# Patient Record
Sex: Female | Born: 1970 | Race: White | Hispanic: No | Marital: Married | Smoking: Former smoker
Health system: Southern US, Community
[De-identification: ages and names within clinical notes are randomized; demographics above are authoritative.]

## PROBLEM LIST (undated history)

## (undated) DIAGNOSIS — K219 Gastro-esophageal reflux disease without esophagitis: Secondary | ICD-10-CM

## (undated) DIAGNOSIS — J342 Deviated nasal septum: Secondary | ICD-10-CM

## (undated) DIAGNOSIS — G4730 Sleep apnea, unspecified: Secondary | ICD-10-CM

## (undated) DIAGNOSIS — R51 Headache: Secondary | ICD-10-CM

## (undated) DIAGNOSIS — R053 Chronic cough: Secondary | ICD-10-CM

## (undated) DIAGNOSIS — R05 Cough: Secondary | ICD-10-CM

## (undated) DIAGNOSIS — J45909 Unspecified asthma, uncomplicated: Secondary | ICD-10-CM

## (undated) DIAGNOSIS — F988 Other specified behavioral and emotional disorders with onset usually occurring in childhood and adolescence: Secondary | ICD-10-CM

## (undated) DIAGNOSIS — T8859XA Other complications of anesthesia, initial encounter: Secondary | ICD-10-CM

## (undated) DIAGNOSIS — F419 Anxiety disorder, unspecified: Secondary | ICD-10-CM

## (undated) DIAGNOSIS — F40298 Other specified phobia: Secondary | ICD-10-CM

## (undated) DIAGNOSIS — T4145XA Adverse effect of unspecified anesthetic, initial encounter: Secondary | ICD-10-CM

## (undated) DIAGNOSIS — Z889 Allergy status to unspecified drugs, medicaments and biological substances status: Secondary | ICD-10-CM

## (undated) HISTORY — PX: WISDOM TOOTH EXTRACTION: SHX21

---

## 2000-10-26 ENCOUNTER — Other Ambulatory Visit: Admission: RE | Admit: 2000-10-26 | Discharge: 2000-10-26 | Payer: Self-pay | Admitting: Obstetrics and Gynecology

## 2000-11-10 ENCOUNTER — Emergency Department (HOSPITAL_COMMUNITY): Admission: EM | Admit: 2000-11-10 | Discharge: 2000-11-10 | Payer: Self-pay | Admitting: *Deleted

## 2001-03-16 ENCOUNTER — Ambulatory Visit (HOSPITAL_COMMUNITY): Admission: RE | Admit: 2001-03-16 | Discharge: 2001-03-16 | Payer: Self-pay | Admitting: Obstetrics and Gynecology

## 2001-03-16 ENCOUNTER — Encounter: Payer: Self-pay | Admitting: Obstetrics and Gynecology

## 2001-06-08 ENCOUNTER — Inpatient Hospital Stay (HOSPITAL_COMMUNITY): Admission: AD | Admit: 2001-06-08 | Discharge: 2001-06-12 | Payer: Self-pay | Admitting: Obstetrics and Gynecology

## 2001-06-13 ENCOUNTER — Encounter: Admission: RE | Admit: 2001-06-13 | Discharge: 2001-07-13 | Payer: Self-pay | Admitting: Obstetrics and Gynecology

## 2002-07-13 ENCOUNTER — Other Ambulatory Visit: Admission: RE | Admit: 2002-07-13 | Discharge: 2002-07-13 | Payer: Self-pay | Admitting: Obstetrics and Gynecology

## 2003-08-16 ENCOUNTER — Other Ambulatory Visit: Admission: RE | Admit: 2003-08-16 | Discharge: 2003-08-16 | Payer: Self-pay | Admitting: Obstetrics and Gynecology

## 2004-10-22 ENCOUNTER — Inpatient Hospital Stay (HOSPITAL_COMMUNITY): Admission: AD | Admit: 2004-10-22 | Discharge: 2004-10-25 | Payer: Self-pay | Admitting: Obstetrics and Gynecology

## 2004-11-27 ENCOUNTER — Other Ambulatory Visit: Admission: RE | Admit: 2004-11-27 | Discharge: 2004-11-27 | Payer: Self-pay | Admitting: Obstetrics and Gynecology

## 2008-03-27 ENCOUNTER — Emergency Department (HOSPITAL_COMMUNITY): Admission: EM | Admit: 2008-03-27 | Discharge: 2008-03-27 | Payer: Self-pay | Admitting: Emergency Medicine

## 2010-08-15 NOTE — Op Note (Signed)
NAMEMarland Kitchen  Jasmine Harrell, Jasmine Harrell              ACCOUNT NO.:  [id number]   MEDICAL RECORD NO.:  [id number]          PATIENT TYPE:  INP   LOCATION:  9137                          FACILITY:  [facility]   PHYSICIAN:  Miguel Aschoff, M.D.       DATE OF BIRTH:  Jun 20, 1970   DATE OF PROCEDURE:  10/22/2004  DATE OF DISCHARGE:                                 OPERATIVE REPORT   PREOPERATIVE DIAGNOSIS:  Intrauterine pregnancy at 38 weeks, previous  cesarean section, in labor.   POSTOPERATIVE DIAGNOSES:  1.  Intrauterine pregnancy at 38 weeks, previous cesarean section, in labor.  2.  Delivery of viable female infant, Apgars 9 and 9.   PROCEDURE:  Repeat low flap transverse cesarean section.   SURGEON:  Dr. Miguel Aschoff   ANESTHESIA:  Spinal.   COMPLICATIONS:  None.   JUSTIFICATION:  The patient is a 40 year old white female, gravida 2, para 1-  0-0-1, status post previous cesarean section.  The patient was scheduled to undergo elective repeat cesarean section on  July 28 but went into spontaneous labor with spontaneous rupture of  membranes at on July 26 and being taken to the operating room at this time  to undergo repeat cesarean section.  The risks, benefits of the procedure  were discussed with the patient.   PROCEDURE:  The patient was taken to the operating room, placed in the  supine position after spinal anesthesia was administered without difficulty.  She was then prepped and draped in the usual sterile fashion.  At this point  a Pfannenstiel incision was made, extended down through subcutaneous tissue  with bleeding points being clamped and coagulated as they were encountered.  The fascia was then identified, incised transversely and separated from the  underlying rectus muscles.  Rectus muscles were divided midline.  Peritoneum was then found and entered, carefully avoiding underlying  structures.  At this point the peritoneal incision was extended under direct  visualization.  The bladder flap was  created and protected with the bladder  blade.  An elliptical transverse incision was made into the lower uterine  segment, the amniotic cavity was entered.  Clear fluid was obtained, and at  this point the patient was delivery of a viable female infant, Apgar 9 at one  minute and 9 at five minutes, from a vertex LOA position.  The baby was  delivered with the assistance of a vacuum extractor.  The baby was handed to  the pediatric team in attendance.  The baby weighed 8 pounds 11 ounces.  At  this point cord bloods were obtained for appropriate studies and the  placenta was delivered without difficulty.  The remaining products of  conception and placenta were removed from the uterus and the uterus was  closed.  The angles of the uterine incision were ligated using figure-of-  eight sutures of number Vicryl.  Then the uterus was closed in layers.  The  first layer was a running interlocking suture of #1 Vicryl, followed by an  imbricating suture of #1 Vicryl.  The bladder flap was then reapproximated  using running continuous 2-0  Vicryl suture.  At this point the abdomen was  irrigated with warm saline and lap counts and instrument counts were taken  and found be correct and then the abdomen was closed.  The parietal  peritoneum was closed using running continuous 0 Vicryl suture.  Rectus  muscles were reapproximated using running continuous 0 Vicryl suture.  The  fascia was closed using two sutures of zero Vicryl, each starting at the  lateral fascial angles and meeting in the midline.  Subcutaneous tissue was  closed  using interrupted 2-0 Vicryl suture.  The skin incision was closed using  staples.  The estimated blood loss was approximately 600 mL.  The patient  tolerated the procedure well and went to the recovery room in satisfactory  condition.  The baby was taken to the nursery in satisfactory condition.       AR/MEDQ  D:  10/22/2004  T:  10/22/2004  Job:  [id number]

## 2010-08-15 NOTE — Discharge Summary (Signed)
Jasmine Harrell, Jasmine Harrell              ACCOUNT NO.:  [id number]   MEDICAL RECORD NO.:  [id number]          PATIENT TYPE:  INP   LOCATION:  9137                          FACILITY:  [facility]   PHYSICIAN:  Randye Lobo, M.D.   DATE OF BIRTH:  1971-01-16   DATE OF ADMISSION:  10/22/2004  DATE OF DISCHARGE:  10/25/2004                                 DISCHARGE SUMMARY   FINAL DIAGNOSES:  1.  Intrauterine pregnancy at 39 weeks' gestation.  2.  Active labor.  3.  History of previous cesarean section, desires repeat cesarean section.   PROCEDURE:  Repeat low flap transverse cesarean section.  Surgeon:  Dr.  Miguel Aschoff. Complications none.   This 40 year old G2, P1-0-0-01, was scheduled to undergo a repeat cesarean  section on July 28.  The patient had a previous cesarean section with her  last pregnancy and desired a repeat with this pregnancy.  Te patient's  antepartum course up to this point had been uncomplicated.  She had a  negative group B strep culture obtained in the office at 35 weeks.  The  patient went into active labor at 38 weeks and is therefore taken to the  operating room to undergo a cesarean section at this time.   The patient was taken to the operating room on October 22, 2004, by Dr. Miguel Aschoff, where a repeat low flap transverse cesarean section was performed with  the delivery of an 8 pound 11 ounce female infant without course of 9 and 9.  The delivery went without complications.  The patient's postoperative course  was benign without significant fevers.  She was felt ready for discharge on  postoperative day #3, was sent home on a regular diet, told to decrease  activities, told to continue her prenatal vitamins, was given Percocet one  to two every four to six hours as needed for pain, told she could use over-  the-counter ibuprofen up to 600 mg every six hours as needed for pain.  Was  to follow up in the office in four weeks.   LABS ON DISCHARGE:  The patient had a  hemoglobin of 12.2, white blood cell  count of 14.4, and platelets of 253,000.      Jasmine Harrell, P.A.-C.      Randye Lobo, M.D.  Electronically Signed    MB/MEDQ  D:  12/04/2004  T:  12/04/2004  Job:  [id number]

## 2010-08-15 NOTE — Discharge Summary (Signed)
[facility]  Patient:    Jasmine Harrell, Jasmine Harrell Visit Number: [id number] MRN: [id number]          Service Type: OBS Location: 910A 9142 01 Attending Physician:  Mickle Mallory Dictated by:   Leilani Able, P.A. Admit Date:  06/08/2001 Discharge Date: 06/12/2001                             Discharge Summary  FINAL DIAGNOSIS:              Intrauterine pregnancy at term with                               cephalopelvic disproportion.  PROCEDURE:                    Primary low transverse cesarean section.  SURGEON:                      Mark E. Dareen Piano, M.D.  COMPLICATIONS:                None.  HOSPITAL COURSE:              This 40 year old G1, P0, presents at 41-1/[redacted] weeks gestation in early labor.  The patient was admitted at this time, begun on Pitocin.  IUPCs were placed at that time to document adequate contraction. The patient got to about 9 cm dilation and failed to progress beyond this point.  The patient was felt to have a large infant, was diagnosed with cephalopelvic disproportion.  Therefore, a discussion was held with the patient at this time and it was thought to proceed with a primary low transverse cesarean section.  The patient was taken to the operating room on June 09, 2001, by Dr. Malva Limes, where a primary low transverse cesarean section was performed with the delivery of a 10 pound 2 ounce female infant with Apgars of 8 and 9.  The delivery was without complication.  The patients postoperative course was benign without significant fevers.  DISCHARGE DISPOSITION:        The patient was started on iron postoperatively, and she was felt ready for discharge on postoperative day #3, sent home on a regular diet, told to decrease activities, told to continue prenatal vitamins and iron sulfate, was given a prescription for Percocet and ibuprofen, and was told to follow up in the office in four weeks. Dictated by:   Leilani Able, P.A. Attending Physician:  Mickle Mallory DD:  06/23/01 TD:  06/24/01 Job: [id number]

## 2010-08-15 NOTE — Op Note (Signed)
[facility]  Patient:    Jasmine Harrell, Jasmine Harrell Visit Number: [id number] MRN: [id number]          Service Type: OBS Location: 910A 9142 01 Attending Physician:  Mickle Mallory Dictated by:   Janeece Riggers Dareen Piano, M.D. Proc. Date: 06/09/01 Admit Date:  06/08/2001   CC:         [name], M.D.   Operative Report  PREOPERATIVE DIAGNOSES:       1. Intrauterine pregnancy at term.                               2. Cephalopelvic disproportion.  POSTOPERATIVE DIAGNOSES:      1. Intrauterine pregnancy at term.                               2. Cephalopelvic disproportion.  PROCEDURE:                    Primary low transverse cesarean section.  SURGEON:                      Mark E. Dareen Piano, M.D.  ANESTHESIA:                   Epidural.  ANTIBIOTICS:                  Cefotan 1 g.  ESTIMATED BLOOD LOSS:         900 cc.  COMPLICATIONS:                None.  SPECIMENS:                    None.  FINDINGS:                     The patient had normal fallopian tubes and ovaries bilaterally.  The uterus appeared to be normal.  The patient did have a minimal amount of meconium.  The placenta and umbilical cord were meconium stained.  The patient delivered one live, viable white female infant weighing 10 lb 2 oz.  INDICATIONS:                  The patient is a 40 year old white female, G1, P0 at 41-1/2 weeks estimated gestational age who presented in early labor on June 08, 2001.  The patient was admitted and begun on Pitocin after she failed to significantly change her cervix at approximately 1:00 a.m.   The patient had no significant change throughout the next eight hours.  IUPC was placed during that time to document adequate contractions.  The patient got to 9 cm and failed to progress beyond this.  The patient was felt to have a large infant and diagnosed with cephalopelvic disproportion.  DESCRIPTION OF PROCEDURE:     The patient was taken to the  operating room, where she was placed in the dorsal supine position.  Epidural anesthetic was reinjected.  Once an adequate level was reached, the patient was prepped with Hibiclens and draped in the usual fashion for this procedure.  The patient did have a Foley catheter placed.  A Pfannenstiel incision was made.  This was carried down to the fascia.  The fascia was entered in the midline and extended laterally with Mayo scissors.  The rectus muscles were then  dissected from the fascia with the Bovie.  The rectus muscles were divided in the midline and taken superiorly and inferiorly.  The parietal peritoneum was entered sharply and taken superiorly and inferiorly.  The bladder flap was taken down sharply.  A low transverse uterine incision was made in the midline and extended laterally with blunt dissection.  On entering the uterine cavity, the amniotic sac was entered and light meconium noted.  The infant was delivered from the vertex presentation.  On delivery of the head, the oropharynx and nostrils were bulb suctioned.  The remaining infant was then delivered.  The cord was doubly clamped and cut and the infant handed to the awaiting NICU team.  Cord blood was then obtained.  The placenta was then manually removed.  The uterus was exteriorized.  The uterine cavity was wiped with a wet lap.  The uterine incision was closed in a single layer of 0 chromic in a running locking fashion.  The visceral peritoneum was closed using 3-0 chromic in a running fashion.  The uterus was placed back in the abdominal cavity.  The abdominal gutters were wiped with a wet lap. Hemostasis was checked and felt to be adequate.  The parietal peritoneum and rectus muscles were reapproximated in the midline using 3-0 chromic in a running fashion.  The fascia was closed using 0 Monocryl in a running fashion. The subcuticular tissue was made hemostatic with the Bovie.  Stainless steel clips were used to close the  skin.  The patient tolerated the procedure well. She was taken to the recovery room in stable condition.  Instrument and lap counts were correct x2. Dictated by:   Janeece Riggers Dareen Piano, M.D. Attending Physician:  Mickle Mallory DD:  06/09/01 TD:  06/10/01 Job: [id number]

## 2012-04-30 DIAGNOSIS — J342 Deviated nasal septum: Secondary | ICD-10-CM

## 2012-04-30 HISTORY — DX: Deviated nasal septum: J34.2

## 2012-05-19 ENCOUNTER — Encounter (HOSPITAL_BASED_OUTPATIENT_CLINIC_OR_DEPARTMENT_OTHER): Payer: Self-pay | Admitting: *Deleted

## 2012-05-25 ENCOUNTER — Encounter (HOSPITAL_BASED_OUTPATIENT_CLINIC_OR_DEPARTMENT_OTHER): Payer: Self-pay | Admitting: Certified Registered"

## 2012-05-25 ENCOUNTER — Encounter (HOSPITAL_BASED_OUTPATIENT_CLINIC_OR_DEPARTMENT_OTHER)
Admission: RE | Disposition: A | Discharge status: Home / Self Care | Payer: Self-pay | Source: Ambulatory Visit | Attending: Otolaryngology

## 2012-05-25 ENCOUNTER — Ambulatory Visit (HOSPITAL_BASED_OUTPATIENT_CLINIC_OR_DEPARTMENT_OTHER): Payer: BC Managed Care – PPO | Admitting: Certified Registered"

## 2012-05-25 ENCOUNTER — Ambulatory Visit (HOSPITAL_BASED_OUTPATIENT_CLINIC_OR_DEPARTMENT_OTHER)
Admission: RE | Admit: 2012-05-25 | Discharge: 2012-05-25 | Disposition: A | Discharge status: Home / Self Care | Payer: BC Managed Care – PPO | Source: Ambulatory Visit | Attending: Otolaryngology | Admitting: Otolaryngology

## 2012-05-25 ENCOUNTER — Encounter (HOSPITAL_BASED_OUTPATIENT_CLINIC_OR_DEPARTMENT_OTHER): Payer: Self-pay | Admitting: *Deleted

## 2012-05-25 DIAGNOSIS — J45909 Unspecified asthma, uncomplicated: Secondary | ICD-10-CM | POA: Insufficient documentation

## 2012-05-25 DIAGNOSIS — Z79899 Other long term (current) drug therapy: Secondary | ICD-10-CM | POA: Insufficient documentation

## 2012-05-25 DIAGNOSIS — J343 Hypertrophy of nasal turbinates: Secondary | ICD-10-CM | POA: Insufficient documentation

## 2012-05-25 DIAGNOSIS — K219 Gastro-esophageal reflux disease without esophagitis: Secondary | ICD-10-CM | POA: Insufficient documentation

## 2012-05-25 DIAGNOSIS — J342 Deviated nasal septum: Secondary | ICD-10-CM | POA: Diagnosis present

## 2012-05-25 HISTORY — DX: Chronic cough: R05.3

## 2012-05-25 HISTORY — DX: Unspecified asthma, uncomplicated: J45.909

## 2012-05-25 HISTORY — DX: Deviated nasal septum: J34.2

## 2012-05-25 HISTORY — DX: Other complications of anesthesia, initial encounter: T88.59XA

## 2012-05-25 HISTORY — DX: Anxiety disorder, unspecified: F41.9

## 2012-05-25 HISTORY — DX: Other specified phobia: F40.298

## 2012-05-25 HISTORY — DX: Other specified behavioral and emotional disorders with onset usually occurring in childhood and adolescence: F98.8

## 2012-05-25 HISTORY — PX: NASAL SEPTOPLASTY W/ TURBINOPLASTY: SHX2070

## 2012-05-25 HISTORY — DX: Cough: R05

## 2012-05-25 HISTORY — DX: Gastro-esophageal reflux disease without esophagitis: K21.9

## 2012-05-25 HISTORY — DX: Headache: R51

## 2012-05-25 HISTORY — DX: Adverse effect of unspecified anesthetic, initial encounter: T41.45XA

## 2012-05-25 LAB — POCT HEMOGLOBIN-HEMACUE: Hemoglobin: 15.2 g/dL — ABNORMAL HIGH (ref 12.0–15.0)

## 2012-05-25 SURGERY — SEPTOPLASTY, NOSE, WITH NASAL TURBINATE REDUCTION
Anesthesia: General | Site: Nose | Laterality: Bilateral | Wound class: Clean Contaminated

## 2012-05-25 MED ORDER — CEFAZOLIN SODIUM-DEXTROSE 2-3 GM-% IV SOLR
2000.0000 mg | Freq: Once | INTRAVENOUS | Status: AC
  Administered 2012-05-25: 2 g via INTRAVENOUS

## 2012-05-25 MED ORDER — DEXAMETHASONE SODIUM PHOSPHATE 4 MG/ML IJ SOLN
INTRAMUSCULAR | Status: DC | PRN
  Administered 2012-05-25: 10 mg via INTRAVENOUS

## 2012-05-25 MED ORDER — HYDROMORPHONE HCL PF 1 MG/ML IJ SOLN: 0.2500 mg | INTRAMUSCULAR | Status: DC | PRN

## 2012-05-25 MED ORDER — OXYCODONE HCL 5 MG/5ML PO SOLN: 5.0000 mg | Freq: Once | ORAL | Status: DC | PRN

## 2012-05-25 MED ORDER — FENTANYL CITRATE 0.05 MG/ML IJ SOLN
INTRAMUSCULAR | Status: DC | PRN
  Administered 2012-05-25: 50 ug via INTRAVENOUS
  Administered 2012-05-25: 100 ug via INTRAVENOUS

## 2012-05-25 MED ORDER — LACTATED RINGERS IV SOLN
INTRAVENOUS | Status: DC
  Administered 2012-05-25: 08:00:00 via INTRAVENOUS

## 2012-05-25 MED ORDER — OXYMETAZOLINE HCL 0.05 % NA SOLN
NASAL | Status: DC | PRN
  Administered 2012-05-25: 1 via NASAL

## 2012-05-25 MED ORDER — FENTANYL CITRATE 0.05 MG/ML IJ SOLN: 50.0000 ug | INTRAMUSCULAR | Status: DC | PRN

## 2012-05-25 MED ORDER — LIDOCAINE HCL (CARDIAC) 20 MG/ML IV SOLN
INTRAVENOUS | Status: DC | PRN
  Administered 2012-05-25: 80 mg via INTRAVENOUS

## 2012-05-25 MED ORDER — MIDAZOLAM HCL 2 MG/2ML IJ SOLN: 1.0000 mg | INTRAMUSCULAR | Status: DC | PRN

## 2012-05-25 MED ORDER — PROMETHAZINE HCL 25 MG/ML IJ SOLN: 6.2500 mg | INTRAMUSCULAR | Status: DC | PRN

## 2012-05-25 MED ORDER — LIDOCAINE-EPINEPHRINE 1 %-1:100000 IJ SOLN
INTRAMUSCULAR | Status: DC | PRN
  Administered 2012-05-25: 6 mL

## 2012-05-25 MED ORDER — HYDROCODONE-ACETAMINOPHEN 5-325 MG PO TABS: 1.0000 | ORAL_TABLET | Freq: Four times a day (QID) | ORAL | Status: DC | PRN

## 2012-05-25 MED ORDER — MIDAZOLAM HCL 5 MG/5ML IJ SOLN
INTRAMUSCULAR | Status: DC | PRN
  Administered 2012-05-25: 2 mg via INTRAVENOUS

## 2012-05-25 MED ORDER — PROPOFOL 10 MG/ML IV BOLUS
INTRAVENOUS | Status: DC | PRN
  Administered 2012-05-25: 160 mg via INTRAVENOUS
  Administered 2012-05-25: 40 mg via INTRAVENOUS

## 2012-05-25 MED ORDER — MUPIROCIN 2 % EX OINT
TOPICAL_OINTMENT | CUTANEOUS | Status: DC | PRN
  Administered 2012-05-25: 1 via NASAL

## 2012-05-25 MED ORDER — OXYCODONE HCL 5 MG PO TABS: 5.0000 mg | ORAL_TABLET | Freq: Once | ORAL | Status: DC | PRN

## 2012-05-25 MED ORDER — MIDAZOLAM HCL 2 MG/ML PO SYRP: 12.0000 mg | ORAL_SOLUTION | Freq: Once | ORAL | Status: DC | PRN

## 2012-05-25 MED ORDER — AMOXICILLIN-POT CLAVULANATE 500-125 MG PO TABS: 1.0000 | ORAL_TABLET | Freq: Two times a day (BID) | ORAL | Status: DC

## 2012-05-25 MED ORDER — MEPERIDINE HCL 25 MG/ML IJ SOLN: 6.2500 mg | INTRAMUSCULAR | Status: DC | PRN

## 2012-05-25 MED ORDER — SUCCINYLCHOLINE CHLORIDE 20 MG/ML IJ SOLN
INTRAMUSCULAR | Status: DC | PRN
  Administered 2012-05-25: 120 mg via INTRAVENOUS

## 2012-05-25 SURGICAL SUPPLY — 34 items
ATTRACTOMAT 16X20 MAGNETIC DRP (DRAPES) IMPLANT
BLADE SURG 15 STRL LF DISP TIS (BLADE) ×1 IMPLANT
BLADE SURG 15 STRL SS (BLADE) ×2
CANISTER SUCTION 1200CC (MISCELLANEOUS) ×2 IMPLANT
CLOTH BEACON ORANGE TIMEOUT ST (SAFETY) ×2 IMPLANT
COAGULATOR SUCT 8FR VV (MISCELLANEOUS) ×1 IMPLANT
DECANTER SPIKE VIAL GLASS SM (MISCELLANEOUS) IMPLANT
DRSG NASOPORE 8CM (GAUZE/BANDAGES/DRESSINGS) IMPLANT
DRSG TELFA 3X8 NADH (GAUZE/BANDAGES/DRESSINGS) IMPLANT
ELECT REM PT RETURN 9FT ADLT (ELECTROSURGICAL)
ELECTRODE REM PT RTRN 9FT ADLT (ELECTROSURGICAL) IMPLANT
GLOVE BIOGEL M 7.0 STRL (GLOVE) ×4 IMPLANT
GLOVE SKINSENSE NS SZ7.0 (GLOVE) ×1
GLOVE SKINSENSE STRL SZ7.0 (GLOVE) IMPLANT
GOWN PREVENTION PLUS XLARGE (GOWN DISPOSABLE) ×3 IMPLANT
NEEDLE 27GAX1X1/2 (NEEDLE) ×2 IMPLANT
NS IRRIG 1000ML POUR BTL (IV SOLUTION) ×1 IMPLANT
PACK BASIN DAY SURGERY FS (CUSTOM PROCEDURE TRAY) ×2 IMPLANT
PACK ENT DAY SURGERY (CUSTOM PROCEDURE TRAY) ×2 IMPLANT
PAD DRESSING TELFA 3X8 NADH (GAUZE/BANDAGES/DRESSINGS) IMPLANT
SET EXT MALE ROTATING LL 32IN (MISCELLANEOUS) ×2 IMPLANT
SET IV EXT TUBING FEMALE 31 (MISCELLANEOUS) ×1 IMPLANT
SLEEVE SCD COMPRESS KNEE MED (MISCELLANEOUS) ×1 IMPLANT
SPLINT NASAL DOYLE BI-VL (GAUZE/BANDAGES/DRESSINGS) ×2 IMPLANT
SPONGE GAUZE 2X2 8PLY STRL LF (GAUZE/BANDAGES/DRESSINGS) ×2 IMPLANT
SPONGE NEURO XRAY DETECT 1X3 (DISPOSABLE) ×2 IMPLANT
SPONGE SURGIFOAM ABS GEL 12-7 (HEMOSTASIS) IMPLANT
SUT ETHILON 3 0 PS 1 (SUTURE) ×2 IMPLANT
SUT PLAIN 4 0 ~~LOC~~ 1 (SUTURE) ×2 IMPLANT
TOWEL OR 17X24 6PK STRL BLUE (TOWEL DISPOSABLE) ×2 IMPLANT
TUBE SALEM SUMP 12R W/ARV (TUBING) IMPLANT
TUBE SALEM SUMP 16 FR W/ARV (TUBING) IMPLANT
WATER STERILE IRR 1000ML POUR (IV SOLUTION) ×1 IMPLANT
YANKAUER SUCT BULB TIP NO VENT (SUCTIONS) ×2 IMPLANT

## 2012-05-25 NOTE — Progress Notes (Signed)
Patient noted to be moving hands and unable to not be moving them. Patient alert and oriented with vital signs stable at present. Dr. Jacklynn Bue [facility] notified and stated he observed patient. As per Dr. Jacklynn Bue, ok to discharge patient home as per standard PACU discharge protocol.

## 2012-05-25 NOTE — Anesthesia Postprocedure Evaluation (Signed)
  Anesthesia Post-op Note  Patient: Jasmine Harrell  Procedure(s) Performed: Procedure(s) with comments: NASAL SEPTOPLASTY WITH TURBINATE REDUCTION (Bilateral) - Nasal Septoplasty with Bilateral Turbinate Reduction  Patient Location: PACU  Anesthesia Type:General  Level of Consciousness: awake and alert   Airway and Oxygen Therapy: Patient Spontanous Breathing  Post-op Pain: mild  Post-op Assessment: Post-op Vital signs reviewed  Post-op Vital Signs: stable  Complications: No apparent anesthesia complications

## 2012-05-25 NOTE — Anesthesia Procedure Notes (Signed)
Procedure Name: Intubation Date/Time: 05/25/2012 8:44 AM Performed by: Verlan Friends Pre-anesthesia Checklist: Patient identified, Emergency Drugs available, Suction available, Patient being monitored and Timeout performed Patient Re-evaluated:Patient Re-evaluated prior to inductionOxygen Delivery Method: Circle System Utilized Preoxygenation: Pre-oxygenation with 100% oxygen Intubation Type: IV induction Ventilation: Mask ventilation without difficulty Laryngoscope Size: Miller and 3 Grade View: Grade I Tube type: Oral Tube size: 7.0 mm Number of attempts: 1 Airway Equipment and Method: stylet and oral airway Placement Confirmation: ETT inserted through vocal cords under direct vision,  positive ETCO2 and breath sounds checked- equal and bilateral Secured at: 21 (21) cm Tube secured with: Tape Dental Injury: Teeth and Oropharynx as per pre-operative assessment

## 2012-05-25 NOTE — H&P (Signed)
Jasmine Harrell is an 42 y.o. female.   Chief Complaint: nasal obstruction HPI: Prog nasal obstruction  Past Medical History  Diagnosis Date  . Headache     sinus 1 x/week; migraine 1 x/month  . GERD (gastroesophageal reflux disease)     no current med.  . Attention deficit disorder (ADD)   . Asthma     exercise-induced; no inhaler use in years  . Chronic cough   . Anxiety   . Needle phobia     hyperventilates with needle sticks  . Complication of anesthesia     hard to wake up post-op  . Deviated septum 04/2012    Past Surgical History  Procedure Laterality Date  . Cesarean section  10/22/2004; 06/08/2001  . Wisdom tooth extraction      History reviewed. No pertinent family history. Social History:  reports that she has never smoked. She has never used smokeless tobacco. She reports that she does not drink alcohol or use illicit drugs.  Allergies:  Allergies  Allergen Reactions  . Erythromycin Nausea And Vomiting    Medications Prior to Admission  Medication Sig Dispense Refill  . dextromethorphan (DELSYM) 30 MG/5ML liquid Take 60 mg by mouth as needed for cough.      . methylphenidate (RITALIN LA) 30 MG 24 hr capsule Take 30 mg by mouth every morning.      . Multiple Vitamin (MULTIVITAMIN) tablet Take 1 tablet by mouth daily.      . Vilazodone HCl (VIIBRYD) 10 MG TABS Take by mouth daily.        Results for orders placed during the hospital encounter of 05/25/12 (from the past 48 hour(s))  POCT HEMOGLOBIN-HEMACUE     Status: Abnormal   Collection Time    05/25/12  8:23 AM      Result Value Range   Hemoglobin 15.2 (*) 12.0 - 15.0 g/dL   No results found.  Review of Systems  Constitutional: Negative.   Respiratory: Negative.   Cardiovascular: Negative.   Musculoskeletal: Negative.   Neurological: Negative.     Blood pressure 133/86, pulse 81, temperature 97.4 F (36.3 C), temperature source Oral, resp. rate 18, height 5\' 4"  (1.626 m), weight 89.472 kg (197  lb 4 oz), last menstrual period 05/18/2012, SpO2 98.00%. Physical Exam  Constitutional: She is oriented to person, place, and time. She appears well-developed and well-nourished.  HENT:  Nose: Septal deviation present.  Neck: Normal range of motion. Neck supple.  Cardiovascular: Normal rate.   Respiratory: Effort normal.  GI: Soft.  Musculoskeletal: Normal range of motion.  Neurological: She is alert and oriented to person, place, and time.     Assessment/Plan Adm for OP nasal surgery  Jeris Roser 05/25/2012, 8:32 AM

## 2012-05-25 NOTE — Brief Op Note (Signed)
05/25/2012  9:31 AM  PATIENT:  Jasmine Harrell  42 y.o. female  PRE-OPERATIVE DIAGNOSIS:  deviated septum;Tubinate Hypertrophy  POST-OPERATIVE DIAGNOSIS:  Deviated Nasal Septum;Tubinate Hypertrophy  PROCEDURE:  Procedure(s) with comments: NASAL SEPTOPLASTY WITH TURBINATE REDUCTION (Bilateral) - Nasal Septoplasty with Bilateral Turbinate Reduction  SURGEON:  Surgeon(s) and Role:    * Osborn Coho, MD - Primary  PHYSICIAN ASSISTANT:   ASSISTANTS: none   ANESTHESIA:   general  EBL:  Total I/O In: 100 [I.V.:100] Out: - <50 cc  BLOOD ADMINISTERED:none  DRAINS: none   LOCAL MEDICATIONS USED:  LIDOCAINE  and Amount: 6 ml  SPECIMEN:  No Specimen  DISPOSITION OF SPECIMEN:  N/A  COUNTS:  YES  TOURNIQUET:  * No tourniquets in log *  DICTATION: .Other Dictation: Dictation Number [id number]  PLAN OF CARE: Discharge to home after PACU  PATIENT DISPOSITION:  PACU - hemodynamically stable.   Delay start of Pharmacological VTE agent (>24hrs) due to surgical blood loss or risk of bleeding: not applicable

## 2012-05-25 NOTE — Transfer of Care (Signed)
Immediate Anesthesia Transfer of Care Note  Patient: Jasmine Harrell  Procedure(s) Performed: Procedure(s) with comments: NASAL SEPTOPLASTY WITH TURBINATE REDUCTION (Bilateral) - Nasal Septoplasty with Bilateral Turbinate Reduction  Patient Location: PACU  Anesthesia Type:General  Level of Consciousness: awake, alert , oriented and patient cooperative  Airway & Oxygen Therapy: Patient Spontanous Breathing and Patient connected to face mask oxygen  Post-op Assessment: Report given to PACU RN and Post -op Vital signs reviewed and stable  Post vital signs: Reviewed and stable  Complications: No apparent anesthesia complications

## 2012-05-25 NOTE — Op Note (Signed)
NAMEJOELLA, SAEFONG              ACCOUNT NO.:  [id number]  MEDICAL RECORD NO.:  [id number]  LOCATION:                                 FACILITY:  PHYSICIAN:  Kinnie Scales. Annalee Genta, M.D.DATE OF BIRTH:  [birth year]  DATE OF PROCEDURE:  05/25/2012 DATE OF DISCHARGE:                              OPERATIVE REPORT   LOCATION:  [facility].  PREOPERATIVE DIAGNOSES: 1. Nasal septal deviation with airway obstruction. 2. Bilateral inferior turbinate hypertrophy.  POSTOPERATIVE DIAGNOSES: 1. Nasal septal deviation with airway obstruction. 2. Bilateral inferior turbinate hypertrophy.  INDICATIONS FOR PROCEDURE: 1. Nasal septal deviation with airway obstruction. 2. Bilateral inferior turbinate hypertrophy.  SURGICAL PROCEDURE: 1. Nasal septoplasty. 2. Bilateral inferior turbinate reduction.  ANESTHESIA:  General endotracheal.  COMPLICATIONS:  None.  ESTIMATED BLOOD LOSS:  Less than 50 mL.  LOCAL ANESTHETIC:  6 mL of 1% lidocaine 1:100,000 solution epinephrine.  DISPOSITION:  The patient was transferred from the operating room to the recovery room in stable condition.  BRIEF HISTORY:  The patient is a 42 year old, white female.  She has been followed in our office with a history of chronic progressive nasal airway obstruction, allergies and sinusitis.  The patient has noted increasing symptoms of nasal obstruction over the last several years, despite appropriate use of medical therapy.  Examination in the office showed a severely deviated septum with bilateral turbinate hypertrophy. Given her history and examination, I recommended the above surgical procedures.  The risks and benefits were discussed in detail with the patient and her husband, and they understood and concurred with our plan for surgery which is scheduled on elective basis as an outpatient at [facility].  PROCEDURE:  The patient was brought to the operating  room on May 25, 2012, and placed in supine position on the operating table.  General endotracheal anesthesia was established without difficulty.  When the patient was adequately anesthetized, she was positioned and prepped and draped.  Her nose was injected with a total of 6 mL of 1% lidocaine, 1:100,000 solution epinephrine, injected in a submucosal fashion along the nasal septum and the inferior turbinates bilaterally.  Her nose was then packed with Afrin-soaked cottonoid pledgets and left in place for approximately 10 minutes for vasoconstriction and hemostasis.  Procedure was begun by creating a left anterior hemitransfixion incision.  Mucoperichondrial flap was elevated on the patient's left- hand side.  The anterior cartilaginous septum was crossed at the midline and a mucoperichondrial flap was elevated on the right.  Deviated bone and cartilage were then carefully dissected preserving the overlying mucosa.  The large inferior septal spur on the left hand side was then mobilized and resected.  The septum was brought to good midline position.  The anterior septal cartilage was then morselized and returned to the mucoperichondrial pocket and the flaps were reapproximated with a 4-0 gut suture on the Cascades needle in a horizontal mattress fashion.  At the conclusion of the procedure, bilateral Doyle nasal septal splints were then placed after the application of Bactroban ointment and sutured in position with a 3-0 Ethilon suture.  Inferior turbinate reduction was then performed with cautery set at 12  watts.  Two submucosal passes were made in each inferior turbinate.  The turbinates had been adequately cauterized.  Anterior incisions were created overlying the soft tissue elevated and a small amount of turbinate bone was resected.  The turbinates were then outfractured to create a more patent nasal cavity.  Nasal cavity was irrigated and suctioned.  Orogastric tube  passed. Stomach contents aspirated.  The patient was then awakened from anesthetic, extubated, and transferred from the operating room to the recovery room in stable condition.  There were no complications and blood loss was less than 50 mL.          ______________________________ Kinnie Scales. Annalee Genta, M.D.     DLS/MEDQ  D:  16/12/9602  T:  05/25/2012  Job:  [id number]

## 2012-05-25 NOTE — Anesthesia Preprocedure Evaluation (Signed)
Anesthesia Evaluation  Patient identified by MRN, date of birth, ID band Patient awake    History of Anesthesia Complications (+) PROLONGED EMERGENCE  Airway Mallampati: II      Dental  (+) Teeth Intact   Pulmonary asthma ,  breath sounds clear to auscultation        Cardiovascular Rhythm:Regular Rate:Normal     Neuro/Psych    GI/Hepatic Neg liver ROS, GERD-  ,  Endo/Other  negative endocrine ROS  Renal/GU negative Renal ROS     Musculoskeletal   Abdominal   Peds  Hematology negative hematology ROS (+)   Anesthesia Other Findings   Reproductive/Obstetrics                           Anesthesia Physical Anesthesia Plan  ASA: II  Anesthesia Plan: General   Post-op Pain Management:    Induction: Intravenous  Airway Management Planned: Oral ETT  Additional Equipment:   Intra-op Plan:   Post-operative Plan: Extubation in OR  Informed Consent: I have reviewed the patients History and Physical, chart, labs and discussed the procedure including the risks, benefits and alternatives for the proposed anesthesia with the patient or authorized representative who has indicated his/her understanding and acceptance.   Dental advisory given  Plan Discussed with: CRNA and Surgeon  Anesthesia Plan Comments:         Anesthesia Quick Evaluation

## 2012-05-26 ENCOUNTER — Encounter (HOSPITAL_BASED_OUTPATIENT_CLINIC_OR_DEPARTMENT_OTHER): Payer: Self-pay | Admitting: Otolaryngology

## 2013-01-09 ENCOUNTER — Other Ambulatory Visit: Payer: Self-pay | Admitting: Obstetrics and Gynecology

## 2013-01-09 DIAGNOSIS — R928 Other abnormal and inconclusive findings on diagnostic imaging of breast: Secondary | ICD-10-CM

## 2013-01-18 ENCOUNTER — Ambulatory Visit
Admission: RE | Admit: 2013-01-18 | Discharge: 2013-01-18 | Disposition: A | Discharge status: Home / Self Care | Payer: BC Managed Care – PPO | Source: Ambulatory Visit | Attending: Obstetrics and Gynecology | Admitting: Obstetrics and Gynecology

## 2013-01-18 DIAGNOSIS — R928 Other abnormal and inconclusive findings on diagnostic imaging of breast: Secondary | ICD-10-CM

## 2013-06-08 ENCOUNTER — Ambulatory Visit
Admission: RE | Admit: 2013-06-08 | Discharge: 2013-06-08 | Disposition: A | Discharge status: Home / Self Care | Payer: BC Managed Care – PPO | Source: Ambulatory Visit | Attending: Internal Medicine | Admitting: Internal Medicine

## 2013-06-08 ENCOUNTER — Other Ambulatory Visit: Payer: Self-pay | Admitting: Internal Medicine

## 2013-06-08 DIAGNOSIS — M533 Sacrococcygeal disorders, not elsewhere classified: Secondary | ICD-10-CM

## 2014-01-29 ENCOUNTER — Other Ambulatory Visit: Payer: Self-pay | Admitting: Obstetrics and Gynecology

## 2014-01-29 DIAGNOSIS — R928 Other abnormal and inconclusive findings on diagnostic imaging of breast: Secondary | ICD-10-CM

## 2014-02-08 ENCOUNTER — Ambulatory Visit
Admission: RE | Admit: 2014-02-08 | Discharge: 2014-02-08 | Disposition: A | Discharge status: Home / Self Care | Payer: BC Managed Care – PPO | Source: Ambulatory Visit | Attending: Obstetrics and Gynecology | Admitting: Obstetrics and Gynecology

## 2014-02-08 DIAGNOSIS — R928 Other abnormal and inconclusive findings on diagnostic imaging of breast: Secondary | ICD-10-CM

## 2014-05-20 ENCOUNTER — Emergency Department (HOSPITAL_BASED_OUTPATIENT_CLINIC_OR_DEPARTMENT_OTHER): Payer: BC Managed Care – PPO

## 2014-05-20 ENCOUNTER — Emergency Department (HOSPITAL_BASED_OUTPATIENT_CLINIC_OR_DEPARTMENT_OTHER)
Admission: EM | Admit: 2014-05-20 | Discharge: 2014-05-20 | Disposition: A | Discharge status: Home / Self Care | Payer: BC Managed Care – PPO | Attending: Emergency Medicine | Admitting: Emergency Medicine

## 2014-05-20 ENCOUNTER — Encounter (HOSPITAL_BASED_OUTPATIENT_CLINIC_OR_DEPARTMENT_OTHER): Payer: Self-pay | Admitting: *Deleted

## 2014-05-20 DIAGNOSIS — R51 Headache: Secondary | ICD-10-CM | POA: Insufficient documentation

## 2014-05-20 DIAGNOSIS — J45909 Unspecified asthma, uncomplicated: Secondary | ICD-10-CM | POA: Diagnosis not present

## 2014-05-20 DIAGNOSIS — Z79899 Other long term (current) drug therapy: Secondary | ICD-10-CM | POA: Insufficient documentation

## 2014-05-20 DIAGNOSIS — R05 Cough: Secondary | ICD-10-CM | POA: Insufficient documentation

## 2014-05-20 DIAGNOSIS — R202 Paresthesia of skin: Secondary | ICD-10-CM | POA: Diagnosis not present

## 2014-05-20 DIAGNOSIS — R062 Wheezing: Secondary | ICD-10-CM

## 2014-05-20 DIAGNOSIS — R079 Chest pain, unspecified: Secondary | ICD-10-CM | POA: Diagnosis present

## 2014-05-20 DIAGNOSIS — M2540 Effusion, unspecified joint: Secondary | ICD-10-CM | POA: Insufficient documentation

## 2014-05-20 DIAGNOSIS — Z87891 Personal history of nicotine dependence: Secondary | ICD-10-CM | POA: Diagnosis not present

## 2014-05-20 DIAGNOSIS — R0789 Other chest pain: Secondary | ICD-10-CM | POA: Diagnosis not present

## 2014-05-20 DIAGNOSIS — K219 Gastro-esophageal reflux disease without esophagitis: Secondary | ICD-10-CM | POA: Diagnosis not present

## 2014-05-20 DIAGNOSIS — F419 Anxiety disorder, unspecified: Secondary | ICD-10-CM | POA: Insufficient documentation

## 2014-05-20 DIAGNOSIS — F909 Attention-deficit hyperactivity disorder, unspecified type: Secondary | ICD-10-CM | POA: Insufficient documentation

## 2014-05-20 DIAGNOSIS — Z792 Long term (current) use of antibiotics: Secondary | ICD-10-CM | POA: Insufficient documentation

## 2014-05-20 LAB — BASIC METABOLIC PANEL
Anion gap: 3 — ABNORMAL LOW (ref 5–15)
BUN: 10 mg/dL (ref 6–23)
CHLORIDE: 107 mmol/L (ref 96–112)
CO2: 24 mmol/L (ref 19–32)
Calcium: 8.8 mg/dL (ref 8.4–10.5)
Creatinine, Ser: 0.81 mg/dL (ref 0.50–1.10)
GFR calc non Af Amer: 88 mL/min — ABNORMAL LOW (ref >90)
GLUCOSE: 102 mg/dL — AB (ref 70–99)
Potassium: 3.8 mmol/L (ref 3.5–5.1)
Sodium: 134 mmol/L — ABNORMAL LOW (ref 135–145)

## 2014-05-20 LAB — CBC
HEMATOCRIT: 42.9 % (ref 36.0–46.0)
HEMOGLOBIN: 14.3 g/dL (ref 12.0–15.0)
MCH: 29.8 pg (ref 26.0–34.0)
MCHC: 33.3 g/dL (ref 30.0–36.0)
MCV: 89.4 fL (ref 78.0–100.0)
Platelets: 325 10*3/uL (ref 150–400)
RBC: 4.8 MIL/uL (ref 3.87–5.11)
RDW: 12.5 % (ref 11.5–15.5)
WBC: 12.7 10*3/uL — AB (ref 4.0–10.5)

## 2014-05-20 LAB — TROPONIN I

## 2014-05-20 MED ORDER — PANTOPRAZOLE SODIUM 20 MG PO TBEC: 20.0000 mg | DELAYED_RELEASE_TABLET | Freq: Two times a day (BID) | ORAL | Status: DC

## 2014-05-20 MED ORDER — ALBUTEROL SULFATE HFA 108 (90 BASE) MCG/ACT IN AERS
2.0000 | INHALATION_SPRAY | Freq: Once | RESPIRATORY_TRACT | Status: AC
  Administered 2014-05-20: 2 via RESPIRATORY_TRACT
  Filled 2014-05-20: qty 6.7

## 2014-05-20 NOTE — ED Provider Notes (Signed)
CSN: [id number]     Arrival date & time 05/20/14  1423 History  This chart was scribed for Toy CookeyMegan Nikiesha Milford, MD by Murriel HopperAlec Bankhead, ED Scribe. This patient was seen in room MH06/MH06 and the patient's care was started at 4:34 PM.    Chief Complaint  Patient presents with  . Chest Pain  . Wheezing      The history is provided by the patient and the spouse. No language interpreter was used.     HPI Comments: Jasmine Harrell is a 44 y.o. female who presents to the Emergency Department complaining of intermittent, burning chest pain that began 4-5 days ago. Pt states that her chest pain was intermittent 3-5 days ago, but notes that it has been more constant the past two days. Pt states she takes a prescription medication for heartburn, but notes that this has not been helping with her pain at all. Pt notes that laying down and eating make the burning sensation in her chest worse. Pt also notes having intermittent numbness and swelling in her left hand and fingers for the past month. Pt states that today she began to have numbness in her left hand and it has not gone away. Pt also notes she has a chronic cough, and states that it is sometimes productive and sometimes nonproductive. Pt also notes having an intermittent headache for the past 4-5 days as well. Pt denies any falls or injuries, neck pain, or hx of cardiovascular issues.      Past Medical History  Diagnosis Date  . Headache(784.0)     sinus 1 x/week; migraine 1 x/month  . GERD (gastroesophageal reflux disease)     no current med.  . Attention deficit disorder (ADD)   . Asthma     exercise-induced; no inhaler use in years  . Chronic cough   . Anxiety   . Needle phobia     hyperventilates with needle sticks  . Complication of anesthesia     hard to wake up post-op  . Deviated septum 04/2012   Past Surgical History  Procedure Laterality Date  . Cesarean section  10/22/2004; 06/08/2001  . Wisdom tooth extraction    . Nasal  septoplasty w/ turbinoplasty Bilateral 05/25/2012    Procedure: NASAL SEPTOPLASTY WITH TURBINATE REDUCTION;  Surgeon: Osborn Cohoavid Shoemaker, MD;  Location: [facility];  Service: ENT;  Laterality: Bilateral;  Nasal Septoplasty with Bilateral Turbinate Reduction   No family history on file. History  Substance Use Topics  . Smoking status: Former Games developermoker  . Smokeless tobacco: Never Used  . Alcohol Use: No   OB History    No data available     Review of Systems  Respiratory: Positive for cough.   Cardiovascular: Positive for chest pain.  Musculoskeletal: Positive for joint swelling. Negative for neck pain.  Neurological: Positive for numbness and headaches.  All other systems reviewed and are negative.     Allergies  Erythromycin  Home Medications   Prior to Admission medications   Medication Sig Start Date End Date Taking? Authorizing Provider  amoxicillin-clavulanate (AUGMENTIN) 500-125 MG per tablet Take 1 tablet (500 mg total) by mouth 2 (two) times daily. 05/25/12   Osborn Cohoavid Shoemaker, MD  dextromethorphan (DELSYM) 30 MG/5ML liquid Take 60 mg by mouth as needed for cough.    Historical Provider, MD  HYDROcodone-acetaminophen (NORCO) 5-325 MG per tablet Take 1-2 tablets by mouth every 6 (six) hours as needed for pain. 05/25/12   Osborn Cohoavid Shoemaker, MD  methylphenidate (RITALIN  LA) 30 MG 24 hr capsule Take 30 mg by mouth every morning.    Historical Provider, MD  Multiple Vitamin (MULTIVITAMIN) tablet Take 1 tablet by mouth daily.    Historical Provider, MD  pantoprazole (PROTONIX) 20 MG tablet Take 1 tablet (20 mg total) by mouth 2 (two) times daily. 05/20/14   Toy Cookey, MD  Vilazodone HCl (VIIBRYD) 10 MG TABS Take by mouth daily.    Historical Provider, MD   BP 122/61 mmHg  Pulse 93  Temp(Src) 98.1 F (36.7 C) (Oral)  Resp 18  Ht  (1.626 m)  Wt 204 lb (92.534 kg)  BMI 35.00 kg/m2  SpO2 98% Physical Exam  Constitutional: She is oriented to person, place, and  time. She appears well-developed and well-nourished. No distress.  HENT:  Head: Normocephalic.  Mouth/Throat: Oropharynx is clear and moist.  Eyes: Pupils are equal, round, and reactive to light.  Neck: Neck supple.  Cardiovascular: Normal rate, regular rhythm and normal heart sounds.   Pulmonary/Chest: Effort normal and breath sounds normal. No respiratory distress. She has no wheezes.  reproduceable tenderness over the chest  Abdominal: Soft. She exhibits no distension. There is no tenderness. There is no rebound and no guarding.  Musculoskeletal: She exhibits no edema or tenderness.  Neurological: She is alert and oriented to person, place, and time.  Skin: Skin is warm and dry.  Psychiatric: She has a normal mood and affect.  Nursing note and vitals reviewed.   ED Course  Procedures (including critical care time)  DIAGNOSTIC STUDIES: Oxygen Saturation is 96% on RA, normal by my interpretation.    COORDINATION OF CARE: 4:44 PM Discussed treatment plan with pt at bedside and pt agreed to plan.   Labs Review Labs Reviewed  CBC - Abnormal; Notable for the following:    WBC 12.7 (*)    All other components within normal limits  BASIC METABOLIC PANEL - Abnormal; Notable for the following:    Sodium 134 (*)    Glucose, Bld 102 (*)    GFR calc non Af Amer 88 (*)    Anion gap 3 (*)    All other components within normal limits  TROPONIN I    Imaging Review Dg Chest 2 View  05/20/2014   CLINICAL DATA:  Cough and congestion.  Chest pain  EXAM: CHEST  2 VIEW  COMPARISON:  None.  FINDINGS: The heart size and mediastinal contours are within normal limits. Both lungs are clear. The visualized skeletal structures are unremarkable.  IMPRESSION: No active cardiopulmonary disease.   Electronically Signed   By: Signa Kell M.D.   On: 05/20/2014 15:51   Ct Head Wo Contrast  05/20/2014   CLINICAL DATA:  Intermittent left hand numbness for a month. Becoming constant today. History of  headaches.  EXAM: CT HEAD WITHOUT CONTRAST  TECHNIQUE: Contiguous axial images were obtained from the base of the skull through the vertex without intravenous contrast.  COMPARISON:  None.  FINDINGS: Ventricles and sulci appear symmetrical. No ventricular dilatation. No mass effect or midline shift. No abnormal extra-axial fluid collections. Gray-white matter junctions are distinct. Basal cisterns are not effaced. No evidence of acute intracranial hemorrhage. No depressed skull fractures. Visualized paranasal sinuses and mastoid air cells are not opacified.  IMPRESSION: No acute intracranial abnormalities.   Electronically Signed   By: Burman Nieves M.D.   On: 05/20/2014 17:09     EKG Interpretation   Date/Time:  Sunday May 20 2014 14:45:42 EST Ventricular Rate:  90 PR Interval:  120 QRS Duration: 74 QT Interval:  354 QTC Calculation: 433 R Axis:   69 Text Interpretation:  Normal sinus rhythm Normal ECG Sinus rhythm Normal  ECG Confirmed by Gerhard Munch  MD ([id number]) on 05/20/2014 2:55:26 PM      MDM   Final diagnoses:  Atypical chest pain  Left hand paresthesia    Pt is a 44 y.o. female with Pmhx as above who presents with  2 weeks of a burning central chest pain radiating into her throat, which is constant.  It is similar to her acid reflux symptoms otherwise been worse recently.  She is currently on amoxicillin and prednisone for an upper respiratory tract infection, continues to have some cough and congestion.  On physical exam vitals are stable and she is in no acute distress.  She has no wheezing, rales or rhonchi.  No lower extremity pain or edema.  EKG is normal.  Troponin is negative.  Chest x-ray is clear.  I do not feel chest pain is cardiac in nature and is likely worsening GERD due to recent prednisone use.  Will switch her to twice a day Protonix. She can otherwise w/u with her PCP.   She is a secondary complaint of left hand paresthesias which have been intermittent  for one month but has been more constant today.  Other than decreased sensation to the fingertips of left hand.  She has no other focal neuro findings.  CT of head is negative.  I feel symptoms would be unlikely for CVA and that her primary doctor can continue workup for this issue as well.  Jasmine Ina evaluation in the Emergency Department is complete. It has been determined that no acute conditions requiring further emergency intervention are present at this time. The patient/guardian have been advised of the diagnosis and plan. We have discussed signs and symptoms that warrant return to the ED, such as changes or worsening in symptoms, worsening pain, fever, inability to tolerate liquids.     I personally performed the services described in this documentation, which was scribed in my presence. The recorded information has been reviewed and is accurate.     Toy Cookey, MD 05/21/14 [phone]

## 2014-05-20 NOTE — ED Notes (Signed)
Pt reports chest pain described as burning x2 weeks, pain is constant and radiates to neck - pt states pain is similar to her hx of acid reflux however her medications for reflux have not improved her symptoms. Pt also admits to intermittent left hand/arm numbness that has been occuring approx 1 month and has been constant today. Pt tearful on arrival to triage.

## 2014-05-20 NOTE — ED Notes (Signed)
Pt walked with supplemental oxygen. Sp02 remained 95-96% throughout. No SOB noted.

## 2014-05-20 NOTE — Discharge Instructions (Signed)
Chest Pain (Nonspecific) °It is often hard to give a specific diagnosis for the cause of chest pain. There is always a chance that your pain could be related to something serious, such as a heart attack or a blood clot in the lungs. You need to follow up with your health care provider for further evaluation. °CAUSES  °· Heartburn. °· Pneumonia or bronchitis. °· Anxiety or stress. °· Inflammation around your heart (pericarditis) or lung (pleuritis or pleurisy). °· A blood clot in the lung. °· A collapsed lung (pneumothorax). It can develop suddenly on its own (spontaneous pneumothorax) or from trauma to the chest. °· Shingles infection (herpes zoster virus). °The chest wall is composed of bones, muscles, and cartilage. Any of these can be the source of the pain. °· The bones can be bruised by injury. °· The muscles or cartilage can be strained by coughing or overwork. °· The cartilage can be affected by inflammation and become sore (costochondritis). °DIAGNOSIS  °Lab tests or other studies may be needed to find the cause of your pain. Your health care provider may have you take a test called an ambulatory electrocardiogram (ECG). An ECG records your heartbeat patterns over a 24-hour period. You may also have other tests, such as: °· Transthoracic echocardiogram (TTE). During echocardiography, sound waves are used to evaluate how blood flows through your heart. °· Transesophageal echocardiogram (TEE). °· Cardiac monitoring. This allows your health care provider to monitor your heart rate and rhythm in real time. °· Holter monitor. This is a portable device that records your heartbeat and can help diagnose heart arrhythmias. It allows your health care provider to track your heart activity for several days, if needed. °· Stress tests by exercise or by giving medicine that makes the heart beat faster. °TREATMENT  °· Treatment depends on what may be causing your chest pain. Treatment may include: °¨ Acid blockers for  heartburn. °¨ Anti-inflammatory medicine. °¨ Pain medicine for inflammatory conditions. °¨ Antibiotics if an infection is present. °· You may be advised to change lifestyle habits. This includes stopping smoking and avoiding alcohol, caffeine, and chocolate. °· You may be advised to keep your head raised (elevated) when sleeping. This reduces the chance of acid going backward from your stomach into your esophagus. °Most of the time, nonspecific chest pain will improve within 2-3 days with rest and mild pain medicine.  °HOME CARE INSTRUCTIONS  °· If antibiotics were prescribed, take them as directed. Finish them even if you start to feel better. °· For the next few days, avoid physical activities that bring on chest pain. Continue physical activities as directed. °· Do not use any tobacco products, including cigarettes, chewing tobacco, or electronic cigarettes. °· Avoid drinking alcohol. °· Only take medicine as directed by your health care provider. °· Follow your health care provider's suggestions for further testing if your chest pain does not go away. °· Keep any follow-up appointments you made. If you do not go to an appointment, you could develop lasting (chronic) problems with pain. If there is any problem keeping an appointment, call to reschedule. °SEEK MEDICAL CARE IF:  °· Your chest pain does not go away, even after treatment. °· You have a rash with blisters on your chest. °· You have a fever. °SEEK IMMEDIATE MEDICAL CARE IF:  °· You have increased chest pain or pain that spreads to your arm, neck, jaw, back, or abdomen. °· You have shortness of breath. °· You have an increasing cough, or you cough   up blood. °· You have severe back or abdominal pain. °· You feel nauseous or vomit. °· You have severe weakness. °· You faint. °· You have chills. °This is an emergency. Do not wait to see if the pain will go away. Get medical help at once. Call your local emergency services (911 in [place] Do not drive  yourself to the hospital. °MAKE SURE YOU:  °· Understand these instructions. °· Will watch your condition. °· Will get help right away if you are not doing well or get worse. °Document Released: 12/24/2004 Document Revised: 03/21/2013 Document Reviewed: 10/20/2007 °ExitCare® Patient Information ©2015 [employer]. This information is not intended to replace advice given to you by your health care provider. Make sure you discuss any questions you have with your health care provider. °Paresthesia °Paresthesia is an abnormal burning or prickling sensation. This sensation is generally felt in the hands, arms, legs, or feet. However, it may occur in any part of the body. It is usually not painful. The feeling may be described as: °· Tingling or numbness. °· "Pins and needles." °· Skin crawling. °· Buzzing. °· Limbs "falling asleep." °· Itching. °Most people experience temporary (transient) paresthesia at some time in their lives. °CAUSES  °Paresthesia may occur when you breathe too quickly (hyperventilation). It can also occur without any apparent cause. Commonly, paresthesia occurs when pressure is placed on a nerve. The feeling quickly goes away once the pressure is removed. For some people, however, paresthesia is a long-lasting (chronic) condition caused by an underlying disorder. The underlying disorder may be: °· A traumatic, direct injury to nerves. Examples include a: °· Broken (fractured) neck. °· Fractured skull. °· A disorder affecting the brain and spinal cord (central nervous system). Examples include: °· Transverse myelitis. °· Encephalitis. °· Transient ischemic attack. °· Multiple sclerosis. °· Stroke. °· Tumor or blood vessel problems, such as an arteriovenous malformation pressing against the brain or spinal cord. °· A condition that damages the peripheral nerves (peripheral neuropathy). Peripheral nerves are not part of the brain and spinal cord. These conditions include: °· Diabetes. °· Peripheral  vascular disease. °· Nerve entrapment syndromes, such as carpal tunnel syndrome. °· Shingles. °· Hypothyroidism. °· Vitamin B12 deficiencies. °· Alcoholism. °· Heavy metal poisoning (lead, arsenic). °· Rheumatoid arthritis. °· Systemic lupus erythematosus. °DIAGNOSIS  °Your caregiver will attempt to find the underlying cause of your paresthesia. Your caregiver may: °· Take your medical history. °· Perform a physical exam. °· Order various lab tests. °· Order imaging tests. °TREATMENT  °Treatment for paresthesia depends on the underlying cause. °HOME CARE INSTRUCTIONS °· Avoid drinking alcohol. °· You may consider massage or acupuncture to help relieve your symptoms. °· Keep all follow-up appointments as directed by your caregiver. °SEEK IMMEDIATE MEDICAL CARE IF:  °· You feel weak. °· You have trouble walking or moving. °· You have problems with speech or vision. °· You feel confused. °· You cannot control your bladder or bowel movements. °· You feel numbness after an injury. °· You faint. °· Your burning or prickling feeling gets worse when walking. °· You have pain, cramps, or dizziness. °· You develop a rash. °MAKE SURE YOU: °· Understand these instructions. °· Will watch your condition. °· Will get help right away if you are not doing well or get worse. °Document Released: 03/06/2002 Document Revised: 06/08/2011 Document Reviewed: 12/05/2010 °ExitCare® Patient Information ©2015 [employer]. This information is not intended to replace advice given to you by your health care provider. Make sure you   discuss any questions you have with your health care provider. ° °

## 2014-06-20 ENCOUNTER — Other Ambulatory Visit: Payer: Self-pay | Admitting: Gastroenterology

## 2014-06-20 DIAGNOSIS — R1310 Dysphagia, unspecified: Secondary | ICD-10-CM

## 2014-06-29 ENCOUNTER — Ambulatory Visit
Admission: RE | Admit: 2014-06-29 | Discharge: 2014-06-29 | Disposition: A | Discharge status: Home / Self Care | Payer: BC Managed Care – PPO | Source: Ambulatory Visit | Attending: Gastroenterology | Admitting: Gastroenterology

## 2014-06-29 DIAGNOSIS — R1310 Dysphagia, unspecified: Secondary | ICD-10-CM

## 2014-10-03 ENCOUNTER — Other Ambulatory Visit: Payer: Self-pay | Admitting: Gastroenterology

## 2014-10-22 ENCOUNTER — Encounter (HOSPITAL_COMMUNITY): Payer: Self-pay | Admitting: *Deleted

## 2014-10-28 ENCOUNTER — Encounter (HOSPITAL_COMMUNITY): Payer: Self-pay | Admitting: Anesthesiology

## 2014-10-28 NOTE — Anesthesia Preprocedure Evaluation (Addendum)
Anesthesia Evaluation  Patient identified by MRN, date of birth, ID band Patient awake    Reviewed: Allergy & Precautions, NPO status , Patient's Chart, lab work & pertinent test results  History of Anesthesia Complications (+) history of anesthetic complications  Airway Mallampati: II  TM Distance: >3 FB Neck ROM: Full    Dental no notable dental hx.    Pulmonary asthma , sleep apnea and Continuous Positive Airway Pressure Ventilation , former smoker,  breath sounds clear to auscultation  Pulmonary exam normal       Cardiovascular negative cardio ROS Normal cardiovascular examRhythm:Regular Rate:Normal     Neuro/Psych  Headaches, PSYCHIATRIC DISORDERS Anxiety    GI/Hepatic Neg liver ROS, GERD-  Medicated,  Endo/Other  negative endocrine ROS  Renal/GU negative Renal ROS  negative genitourinary   Musculoskeletal negative musculoskeletal ROS (+)   Abdominal   Peds negative pediatric ROS (+)  Hematology negative hematology ROS (+)   Anesthesia Other Findings   Reproductive/Obstetrics negative OB ROS                             Anesthesia Physical Anesthesia Plan  ASA: II  Anesthesia Plan: MAC   Post-op Pain Management:    Induction: Intravenous  Airway Management Planned: Natural Airway  Additional Equipment:   Intra-op Plan:   Post-operative Plan:   Informed Consent: I have reviewed the patients History and Physical, chart, labs and discussed the procedure including the risks, benefits and alternatives for the proposed anesthesia with the patient or authorized representative who has indicated his/her understanding and acceptance.   Dental advisory given  Plan Discussed with: CRNA  Anesthesia Plan Comments:         Anesthesia Quick Evaluation

## 2014-10-29 ENCOUNTER — Ambulatory Visit (HOSPITAL_COMMUNITY): Payer: BC Managed Care – PPO | Admitting: Anesthesiology

## 2014-10-29 ENCOUNTER — Encounter (HOSPITAL_COMMUNITY): Payer: Self-pay

## 2014-10-29 ENCOUNTER — Ambulatory Visit (HOSPITAL_COMMUNITY)
Admission: RE | Admit: 2014-10-29 | Discharge: 2014-10-29 | Disposition: A | Discharge status: Home / Self Care | Payer: BC Managed Care – PPO | Source: Ambulatory Visit | Attending: Gastroenterology | Admitting: Gastroenterology

## 2014-10-29 ENCOUNTER — Encounter (HOSPITAL_COMMUNITY)
Admission: RE | Disposition: A | Discharge status: Home / Self Care | Payer: Self-pay | Source: Ambulatory Visit | Attending: Gastroenterology

## 2014-10-29 DIAGNOSIS — R1310 Dysphagia, unspecified: Secondary | ICD-10-CM | POA: Diagnosis not present

## 2014-10-29 DIAGNOSIS — Z79899 Other long term (current) drug therapy: Secondary | ICD-10-CM | POA: Diagnosis not present

## 2014-10-29 DIAGNOSIS — J45909 Unspecified asthma, uncomplicated: Secondary | ICD-10-CM | POA: Insufficient documentation

## 2014-10-29 DIAGNOSIS — G4733 Obstructive sleep apnea (adult) (pediatric): Secondary | ICD-10-CM | POA: Insufficient documentation

## 2014-10-29 DIAGNOSIS — K219 Gastro-esophageal reflux disease without esophagitis: Secondary | ICD-10-CM | POA: Diagnosis not present

## 2014-10-29 DIAGNOSIS — R05 Cough: Secondary | ICD-10-CM | POA: Insufficient documentation

## 2014-10-29 DIAGNOSIS — F419 Anxiety disorder, unspecified: Secondary | ICD-10-CM | POA: Diagnosis not present

## 2014-10-29 DIAGNOSIS — Z87891 Personal history of nicotine dependence: Secondary | ICD-10-CM | POA: Insufficient documentation

## 2014-10-29 DIAGNOSIS — R51 Headache: Secondary | ICD-10-CM | POA: Diagnosis not present

## 2014-10-29 HISTORY — PX: ESOPHAGOGASTRODUODENOSCOPY (EGD) WITH PROPOFOL: SHX5813

## 2014-10-29 HISTORY — DX: Sleep apnea, unspecified: G47.30

## 2014-10-29 HISTORY — DX: Allergy status to unspecified drugs, medicaments and biological substances: Z88.9

## 2014-10-29 SURGERY — ESOPHAGOGASTRODUODENOSCOPY (EGD) WITH PROPOFOL
Anesthesia: Monitor Anesthesia Care

## 2014-10-29 MED ORDER — LACTATED RINGERS IV SOLN: INTRAVENOUS | Status: DC

## 2014-10-29 MED ORDER — MIDAZOLAM HCL 5 MG/5ML IJ SOLN
INTRAMUSCULAR | Status: DC | PRN
  Administered 2014-10-29 (×2): 1 mg via INTRAVENOUS

## 2014-10-29 MED ORDER — KETAMINE HCL 10 MG/ML IJ SOLN
INTRAMUSCULAR | Status: AC
  Filled 2014-10-29: qty 1

## 2014-10-29 MED ORDER — PROPOFOL 10 MG/ML IV BOLUS
INTRAVENOUS | Status: DC | PRN
  Administered 2014-10-29 (×3): 20 mg via INTRAVENOUS
  Administered 2014-10-29 (×2): 30 mg via INTRAVENOUS
  Administered 2014-10-29: 20 mg via INTRAVENOUS

## 2014-10-29 MED ORDER — LIDOCAINE HCL 1 % IJ SOLN
INTRAMUSCULAR | Status: DC | PRN
  Administered 2014-10-29: 50 mg via INTRADERMAL

## 2014-10-29 MED ORDER — LIDOCAINE HCL (CARDIAC) 20 MG/ML IV SOLN
INTRAVENOUS | Status: AC
  Filled 2014-10-29: qty 5

## 2014-10-29 MED ORDER — PROPOFOL 10 MG/ML IV BOLUS
INTRAVENOUS | Status: AC
  Filled 2014-10-29: qty 20

## 2014-10-29 MED ORDER — SODIUM CHLORIDE 0.9 % IV SOLN
INTRAVENOUS | Status: DC
Start: 2014-10-29 — End: 2014-10-29

## 2014-10-29 MED ORDER — MIDAZOLAM HCL 2 MG/2ML IJ SOLN
INTRAMUSCULAR | Status: AC
  Filled 2014-10-29: qty 2

## 2014-10-29 MED ORDER — LACTATED RINGERS IV SOLN
INTRAVENOUS | Status: DC | PRN
  Administered 2014-10-29: 07:00:00 via INTRAVENOUS

## 2014-10-29 SURGICAL SUPPLY — 15 items

## 2014-10-29 NOTE — Discharge Instructions (Signed)
Esophagogastroduodenoscopy °Care After °Refer to this sheet in the next few weeks. These instructions provide you with information on caring for yourself after your procedure. Your caregiver may also give you more specific instructions. Your treatment has been planned according to current medical practices, but problems sometimes occur. Call your caregiver if you have any problems or questions after your procedure.  °HOME CARE INSTRUCTIONS °· Do not eat or drink anything until the numbing medicine (local anesthetic) has worn off and your gag reflex has returned. You will know that the local anesthetic has worn off when you can swallow comfortably. °· Do not drive for 12 hours after the procedure or as directed by your caregiver. °· Only take medicines as directed by your caregiver. °SEEK MEDICAL CARE IF:  °· You cannot stop coughing. °· You are not urinating at all or less than usual. °SEEK IMMEDIATE MEDICAL CARE IF: °· You have difficulty swallowing. °· You cannot eat or drink. °· You have worsening throat or chest pain. °· You have dizziness, lightheadedness, or you faint. °· You have nausea or vomiting. °· You have chills. °· You have a fever. °· You have severe abdominal pain. °· You have black, tarry, or bloody stools. °Document Released: 03/02/2012 Document Reviewed: 03/02/2012 °ExitCare® Patient Information ©2015 [employer]. This information is not intended to replace advice given to you by your health care provider. Make sure you discuss any questions you have with your health care provider. ° °

## 2014-10-29 NOTE — Op Note (Signed)
Problems: Esophageal dysphagia, chronic cough, asthma, obstructive sleep apnea syndrome, gastroesophageal reflux  Endoscopist: Danise Edge  Premedication: Propofol administered by anesthesia  Procedure: Diagnostic esophagogastroduodenoscopy The patient was placed in the left lateral decubitus position. The Pentax gastroscope was passed through the posterior hypopharynx into the proximal esophagus without difficulty. The hypopharynx, larynx, and vocal cords appeared normal.  Esophagoscopy: The proximal, mid, and lower segments of the esophageal mucosa appeared normal. The squamocolumnar junction was regular in appearance and noted at approximately 36 cm from the incisor teeth. There was no endoscopic evidence for the presence of erosive esophagitis or Barrett's esophagus. Random esophageal biopsies were performed to look for eosinophilic esophagitis.  Gastroscopy: Retroflex view of the gastric cardia and fundus was normal. The gastric body, antrum, and pylorus appeared normal.  Duodenoscopy: The duodenal bulb and descending duodenum appeared normal.  Assessment: Normal esophagogastroduodenoscopy. Random esophageal biopsies were performed to look for eosinophilic esophagitis.  Recommendation: Continue taking proton pump inhibitor therapy to prevent gastroesophageal acid reflux.

## 2014-10-29 NOTE — Transfer of Care (Signed)
Immediate Anesthesia Transfer of Care Note  Patient: Jasmine Harrell  Procedure(s) Performed: Procedure(s): ESOPHAGOGASTRODUODENOSCOPY (EGD) WITH PROPOFOL (N/A)  Patient Location: PACU and Endoscopy Unit  Anesthesia Type:MAC  Level of Consciousness: awake, alert , oriented and patient cooperative  Airway & Oxygen Therapy: Patient Spontanous Breathing and Patient connected to nasal cannula oxygen  Post-op Assessment: Report given to RN and Post -op Vital signs reviewed and stable  Post vital signs: Reviewed and stable  Last Vitals:  Filed Vitals:   10/29/14 0631  BP: 121/81  Pulse: 78  Temp: 36.7 C  Resp: 29    Complications: No apparent anesthesia complications

## 2014-10-29 NOTE — H&P (Signed)
  Problems: Esophageal dysphagia and chronic cough  History: The patient is a 44 year old female who takes Prevacid before breakfast each morning with good control of her heartburn and resolution of her esophageal dysphagia symptoms. Proton pump inhibitor therapy has not significantly improved her chronic nonproductive cough.  On 06/29/2014 she underwent a barium esophagram with barium tablet which showed normal esophageal motility, normal esophageal mucosal pattern, and no esophageal obstruction. The barium tablet transiently paused at the esophagogastric junction before spontaneously entering the stomach.  The patient is scheduled to undergo diagnostic esophagogastroduodenoscopy.  Past medical history: Asthma. Chronic sinusitis. Gastroesophageal reflux. Deviated nasal septum. Fibrocystic breast disease. Obstructive sleep apnea syndrome. Nasal surgery.  Medication allergies: Erythromycin. Sulfa drugs. Topamax.  Exam: The patient is alert and lying comfortably on the endoscopy stretcher. Abdomen is soft and nontender to palpation. Lungs are clear to auscultation. Cardiac exam reveals a regular rhythm.  Plan: Proceed with diagnostic esophagogastroduodenoscopy, possible esophageal stricture dilation, and possible esophageal biopsies to look for eosinophilic esophagitis.

## 2014-10-29 NOTE — Anesthesia Postprocedure Evaluation (Signed)
  Anesthesia Post-op Note  Patient: Jasmine Harrell  Procedure(s) Performed: Procedure(s) (LRB): ESOPHAGOGASTRODUODENOSCOPY (EGD) WITH PROPOFOL (N/A)  Patient Location: PACU  Anesthesia Type: MAC  Level of Consciousness: awake and alert   Airway and Oxygen Therapy: Patient Spontanous Breathing  Post-op Pain: mild  Post-op Assessment: Post-op Vital signs reviewed, Patient's Cardiovascular Status Stable, Respiratory Function Stable, Patent Airway and No signs of Nausea or vomiting  Last Vitals:  Filed Vitals:   10/29/14 0810  BP: 110/65  Pulse: 81  Temp:   Resp: 19    Post-op Vital Signs: stable   Complications: No apparent anesthesia complications

## 2014-10-30 ENCOUNTER — Encounter (HOSPITAL_COMMUNITY): Payer: Self-pay | Admitting: Gastroenterology

## 2015-01-30 ENCOUNTER — Other Ambulatory Visit: Payer: Self-pay | Admitting: Obstetrics and Gynecology

## 2015-01-31 LAB — CYTOLOGY - PAP

## 2015-02-05 ENCOUNTER — Other Ambulatory Visit: Payer: Self-pay | Admitting: Obstetrics and Gynecology

## 2015-02-05 DIAGNOSIS — R928 Other abnormal and inconclusive findings on diagnostic imaging of breast: Secondary | ICD-10-CM

## 2015-02-13 ENCOUNTER — Ambulatory Visit
Admission: RE | Admit: 2015-02-13 | Discharge: 2015-02-13 | Disposition: A | Discharge status: Home / Self Care | Payer: BC Managed Care – PPO | Source: Ambulatory Visit | Attending: Obstetrics and Gynecology | Admitting: Obstetrics and Gynecology

## 2015-02-13 DIAGNOSIS — R928 Other abnormal and inconclusive findings on diagnostic imaging of breast: Secondary | ICD-10-CM

## 2015-09-19 ENCOUNTER — Other Ambulatory Visit: Payer: Self-pay | Admitting: Internal Medicine

## 2015-09-19 ENCOUNTER — Ambulatory Visit
Admission: RE | Admit: 2015-09-19 | Discharge: 2015-09-19 | Disposition: A | Discharge status: Home / Self Care | Payer: BC Managed Care – PPO | Source: Ambulatory Visit | Attending: Internal Medicine | Admitting: Internal Medicine

## 2015-09-19 DIAGNOSIS — R059 Cough, unspecified: Secondary | ICD-10-CM

## 2015-09-19 DIAGNOSIS — R05 Cough: Secondary | ICD-10-CM

## 2016-04-06 ENCOUNTER — Other Ambulatory Visit: Payer: Self-pay | Admitting: Obstetrics and Gynecology

## 2016-04-07 ENCOUNTER — Other Ambulatory Visit: Payer: Self-pay | Admitting: Obstetrics and Gynecology

## 2016-04-07 DIAGNOSIS — R928 Other abnormal and inconclusive findings on diagnostic imaging of breast: Secondary | ICD-10-CM

## 2016-04-08 LAB — CYTOLOGY - PAP

## 2016-04-15 ENCOUNTER — Other Ambulatory Visit: Payer: BC Managed Care – PPO

## 2016-04-22 ENCOUNTER — Ambulatory Visit
Admission: RE | Admit: 2016-04-22 | Discharge: 2016-04-22 | Disposition: A | Discharge status: Home / Self Care | Payer: BC Managed Care – PPO | Source: Ambulatory Visit | Attending: Obstetrics and Gynecology | Admitting: Obstetrics and Gynecology

## 2016-04-22 DIAGNOSIS — R928 Other abnormal and inconclusive findings on diagnostic imaging of breast: Secondary | ICD-10-CM

## 2016-10-02 ENCOUNTER — Other Ambulatory Visit: Payer: Self-pay | Admitting: Obstetrics and Gynecology

## 2016-10-02 DIAGNOSIS — N63 Unspecified lump in unspecified breast: Secondary | ICD-10-CM

## 2016-10-21 ENCOUNTER — Other Ambulatory Visit: Payer: BC Managed Care – PPO

## 2016-10-28 ENCOUNTER — Ambulatory Visit
Admission: RE | Admit: 2016-10-28 | Discharge: 2016-10-28 | Disposition: A | Discharge status: Home / Self Care | Payer: BC Managed Care – PPO | Source: Ambulatory Visit | Attending: Obstetrics and Gynecology | Admitting: Obstetrics and Gynecology

## 2016-10-28 DIAGNOSIS — N63 Unspecified lump in unspecified breast: Secondary | ICD-10-CM

## 2017-04-22 DIAGNOSIS — R053 Chronic cough: Secondary | ICD-10-CM | POA: Insufficient documentation

## 2017-04-23 ENCOUNTER — Other Ambulatory Visit: Payer: Self-pay | Admitting: Obstetrics and Gynecology

## 2017-04-23 DIAGNOSIS — R928 Other abnormal and inconclusive findings on diagnostic imaging of breast: Secondary | ICD-10-CM

## 2017-04-28 DIAGNOSIS — R490 Dysphonia: Secondary | ICD-10-CM | POA: Insufficient documentation

## 2017-05-03 ENCOUNTER — Ambulatory Visit
Admission: RE | Admit: 2017-05-03 | Discharge: 2017-05-03 | Disposition: A | Discharge status: Home / Self Care | Payer: BC Managed Care – PPO | Source: Ambulatory Visit | Attending: Obstetrics and Gynecology | Admitting: Obstetrics and Gynecology

## 2017-05-03 DIAGNOSIS — R928 Other abnormal and inconclusive findings on diagnostic imaging of breast: Secondary | ICD-10-CM

## 2017-09-06 ENCOUNTER — Ambulatory Visit: Payer: BC Managed Care – PPO | Admitting: Sports Medicine

## 2017-09-13 ENCOUNTER — Ambulatory Visit: Payer: BC Managed Care – PPO | Admitting: Sports Medicine

## 2017-09-14 ENCOUNTER — Ambulatory Visit: Payer: BC Managed Care – PPO | Admitting: Sports Medicine

## 2017-09-14 VITALS — BP 144/80 | Ht 64.0 in | Wt 212.0 lb

## 2017-09-14 DIAGNOSIS — M76829 Posterior tibial tendinitis, unspecified leg: Secondary | ICD-10-CM

## 2017-09-15 ENCOUNTER — Encounter: Payer: Self-pay | Admitting: Sports Medicine

## 2017-09-15 NOTE — Progress Notes (Signed)
   Subjective:    Patient ID: Jasmine Harrell, female    DOB: 02/11/1971, 47 y.o.   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 She denies any injury but rather describes a gradual onset of pain that she localizes primarily along the medial ankle. She admits that she gets swelling in both ankles at the end of each day but this is chronic. Her pain improves at rest. She has purchased some off-the-shelf inserts for her shoes, primarily for a previous diagnosis of plantar fasciitis, but they do not appear to be helping her ankle pain. She denies pain in the lateral ankle. Denies pain elsewhere in the foot. She was referred to us today by her primary care physician. She denies previous surgeries to the foot or ankle. No numbness or tingling. She takes an occasional ibuprofen as needed for pain. He has purchased an ankle compression sleeve which she has found to be very helpful.  Past medical history reviewed Medications reviewed Allergies reviewed    Review of Systems As above    Objective:   Physical Exam  Obese. No acute distress. Sitting comfortably in the exam room.Vital signs reviewed  Examination of both feet in the standing position shows pes planus bilaterally, left greater than right. Normal calcaneal inversion bilaterally when standing on tiptoes. Patient is tender to palpation along the medial ankle, specifically along the course of the posterior tibialis tendon. There is some mild soft tissue swelling in this area as well. She has no tenderness to palpation over the lateral ankle. Negative anterior drawer, negative talar tilt. Normal passive subtalar motion. Neurovascularly intact distally. Pronation with walking.       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 They do not provide any arch support. We are going to start with a green sports insole and a scaphoid pad and she will follow-up with me in 4 weeks for reevaluation. If she finds this to be helpful then we will consider custom orthotics at that time. She will continue with her compression sleeve when active and will call me with questions or concerns prior to her follow-up visit.

## 2017-10-19 ENCOUNTER — Ambulatory Visit: Payer: BC Managed Care – PPO | Admitting: Sports Medicine

## 2017-10-19 VITALS — BP 118/88 | Ht 64.0 in | Wt 212.0 lb

## 2017-10-19 DIAGNOSIS — M76829 Posterior tibial tendinitis, unspecified leg: Secondary | ICD-10-CM | POA: Diagnosis not present

## 2017-10-19 NOTE — Progress Notes (Signed)
   Subjective:    Patient ID: Jasmine Harrell, female    DOB: 1970-11-04, 47 y.o.   MRN: [id number]  HPI Patient is a 47 year old female who presents today to follow-up on left ankle pain.  Patient was diagnosed with grade 1 posterior tibialis tendinopathy.  Patient reports that since last office visit pain has significantly improve with ankle exercises.  This past week patient went to the beach reports some ankle swelling after walking on uneven ground i.e. sand.  Since returning from the beach swelling has improved she reports minimal tenderness.  Patient denies taking any ibuprofen or NSAIDs.  She initially use a compression sleeve for ankle swelling but has not used one in the past few days.  Patient denies any recent trauma or injury.  She still has very minimal swelling.  Patient reports that she has been oriented Green insole with scaphoid pad reports is been helping her.  Review of Systems Left ankle tenderness, left ankle swelling    Objective:   Physical Exam   Left ankle/foot exam: Bilateral pes planus with left greater than right.  No calcaneus inversion while standing on tiptoe.  Mild tenderness with palpation to the medial aspect of the ankle.  Mild swelling noted around the calcaneus.  No joint laxity.  Good strength and sensation.  Range of motion intact.     Assessment & Plan:   Follow-up for type I posterior tibialis tendon insufficiency, improving Patient reports significant improvement with ankle exercises as well as new green insoles with scaphoid pads.  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  Patient would like to continue wearing green insoles until school starts before doing custom orthotics.  She will follow-up before school starts in August.   Lovena NeighboursAbdoulaye Ercelle Winkles, MD [facility], PGY-3  Patient seen and evaluated with the resident. I agree with the above plan of care. Patient is doing well with her green  sports insoles and scaphoid pads. She would like to wait on custom orthotics. We provided her with the information for [employer] so that she may order additional inserts and scaphoid pads from the company itself. She will follow-up as needed.

## 2019-09-06 DIAGNOSIS — E119 Type 2 diabetes mellitus without complications: Secondary | ICD-10-CM | POA: Insufficient documentation

## 2020-09-05 ENCOUNTER — Other Ambulatory Visit: Payer: Self-pay

## 2020-09-05 ENCOUNTER — Ambulatory Visit (INDEPENDENT_AMBULATORY_CARE_PROVIDER_SITE_OTHER): Payer: BC Managed Care – PPO

## 2020-09-05 ENCOUNTER — Ambulatory Visit: Payer: BC Managed Care – PPO | Admitting: Sports Medicine

## 2020-09-05 ENCOUNTER — Encounter: Payer: Self-pay | Admitting: Sports Medicine

## 2020-09-05 DIAGNOSIS — F3011 Manic episode without psychotic symptoms, mild: Secondary | ICD-10-CM | POA: Insufficient documentation

## 2020-09-05 DIAGNOSIS — M779 Enthesopathy, unspecified: Secondary | ICD-10-CM

## 2020-09-05 DIAGNOSIS — Z0000 Encounter for general adult medical examination without abnormal findings: Secondary | ICD-10-CM | POA: Insufficient documentation

## 2020-09-05 DIAGNOSIS — F329 Major depressive disorder, single episode, unspecified: Secondary | ICD-10-CM | POA: Insufficient documentation

## 2020-09-05 DIAGNOSIS — F909 Attention-deficit hyperactivity disorder, unspecified type: Secondary | ICD-10-CM | POA: Insufficient documentation

## 2020-09-05 DIAGNOSIS — M179 Osteoarthritis of knee, unspecified: Secondary | ICD-10-CM | POA: Insufficient documentation

## 2020-09-05 DIAGNOSIS — E559 Vitamin D deficiency, unspecified: Secondary | ICD-10-CM | POA: Insufficient documentation

## 2020-09-05 DIAGNOSIS — M25572 Pain in left ankle and joints of left foot: Secondary | ICD-10-CM | POA: Diagnosis not present

## 2020-09-05 DIAGNOSIS — F4320 Adjustment disorder, unspecified: Secondary | ICD-10-CM | POA: Insufficient documentation

## 2020-09-05 DIAGNOSIS — L299 Pruritus, unspecified: Secondary | ICD-10-CM | POA: Insufficient documentation

## 2020-09-05 DIAGNOSIS — M722 Plantar fascial fibromatosis: Secondary | ICD-10-CM | POA: Insufficient documentation

## 2020-09-05 DIAGNOSIS — E6609 Other obesity due to excess calories: Secondary | ICD-10-CM | POA: Insufficient documentation

## 2020-09-05 DIAGNOSIS — M533 Sacrococcygeal disorders, not elsewhere classified: Secondary | ICD-10-CM | POA: Insufficient documentation

## 2020-09-05 DIAGNOSIS — G4721 Circadian rhythm sleep disorder, delayed sleep phase type: Secondary | ICD-10-CM | POA: Insufficient documentation

## 2020-09-05 DIAGNOSIS — K625 Hemorrhage of anus and rectum: Secondary | ICD-10-CM | POA: Insufficient documentation

## 2020-09-05 DIAGNOSIS — I8390 Asymptomatic varicose veins of unspecified lower extremity: Secondary | ICD-10-CM | POA: Insufficient documentation

## 2020-09-05 DIAGNOSIS — R12 Heartburn: Secondary | ICD-10-CM | POA: Insufficient documentation

## 2020-09-05 DIAGNOSIS — R0683 Snoring: Secondary | ICD-10-CM | POA: Insufficient documentation

## 2020-09-05 DIAGNOSIS — M79672 Pain in left foot: Secondary | ICD-10-CM | POA: Diagnosis not present

## 2020-09-05 DIAGNOSIS — R631 Polydipsia: Secondary | ICD-10-CM | POA: Insufficient documentation

## 2020-09-05 DIAGNOSIS — J209 Acute bronchitis, unspecified: Secondary | ICD-10-CM | POA: Insufficient documentation

## 2020-09-05 DIAGNOSIS — F419 Anxiety disorder, unspecified: Secondary | ICD-10-CM | POA: Insufficient documentation

## 2020-09-05 DIAGNOSIS — K219 Gastro-esophageal reflux disease without esophagitis: Secondary | ICD-10-CM | POA: Insufficient documentation

## 2020-09-05 DIAGNOSIS — F309 Manic episode, unspecified: Secondary | ICD-10-CM | POA: Insufficient documentation

## 2020-09-05 DIAGNOSIS — G4710 Hypersomnia, unspecified: Secondary | ICD-10-CM | POA: Insufficient documentation

## 2020-09-05 DIAGNOSIS — R609 Edema, unspecified: Secondary | ICD-10-CM | POA: Insufficient documentation

## 2020-09-05 DIAGNOSIS — J45909 Unspecified asthma, uncomplicated: Secondary | ICD-10-CM | POA: Insufficient documentation

## 2020-09-05 DIAGNOSIS — R1310 Dysphagia, unspecified: Secondary | ICD-10-CM | POA: Insufficient documentation

## 2020-09-05 DIAGNOSIS — J309 Allergic rhinitis, unspecified: Secondary | ICD-10-CM | POA: Insufficient documentation

## 2020-09-05 DIAGNOSIS — R3589 Other polyuria: Secondary | ICD-10-CM | POA: Insufficient documentation

## 2020-09-05 MED ORDER — PREDNISONE 10 MG (21) PO TBPK: ORAL_TABLET | ORAL | 0 refills | Status: DC

## 2020-09-05 NOTE — Progress Notes (Signed)
Subjective: NERA HAWORTH is a 50 y.o. female patient who presents to office for evaluation of left ankle and foot pain. Patient complains of progressive pain especially over the last 2 months that has gotten worse over the last week. States that she saw ortho and they told her that she had arthritis of the 2nd toe on the left even though she has no pain there, reports that she was also told that her symptoms are from overuse and gave her Mobic of which she started yesterday. Pain is worse with out the CAM boot and got so bad that she even had to use crutches. States that pain is not too bad today. Patient denies any other pedal complaints. Denies injury/trip/fall/sprain/any causative factors.   Patient is a [employer]ner, broadcasting/film/video.  Patient is diabetic FBS not recorded. Last A1c around 6 and last visit to PCP was a few weeks ago where she got bloodwork.  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   Disorder of sacrum 09/05/2020   Dysphagia 09/05/2020   Edema 09/05/2020   Encounter for general adult medical examination without abnormal findings 09/05/2020   Excessive thirst 09/05/2020   Gastro-esophageal reflux disease without esophagitis 09/05/2020   Heartburn 09/05/2020   Hypersomnia 09/05/2020   Manic disorder, single episode (HCC) 09/05/2020   Mild bipolar I disorder, single manic episode (HCC) 09/05/2020   Obesity due to excess calories 09/05/2020   Asthma without status asthmaticus 09/05/2020   Osteoarthritis of knee 09/05/2020   Acute bronchitis 09/05/2020   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   Polyuria 09/05/2020   Pruritus 09/05/2020   Reactive depression (situational) 09/05/2020   Rectal bleeding 09/05/2020   Snoring 09/05/2020   Varicose veins of lower extremity 09/05/2020   Vitamin D  deficiency 09/05/2020   Type 2 diabetes mellitus without complications (HCC) 09/06/2019   Hoarseness 04/28/2017   Chronic cough 04/22/2017   Nose septum deviation 05/25/2012    Class: Chronic    Current Outpatient Medications on File Prior to Visit  Medication Sig Dispense Refill   fluticasone furoate-vilanterol (BREO ELLIPTA) 200-25 MCG/INH AEPB      lansoprazole (PREVACID) 30 MG capsule 1 capsule     levocetirizine (XYZAL) 5 MG tablet levocetirizine 5 mg tablet     methylphenidate (RITALIN LA) 30 MG 24 hr capsule 1 capsule in the morning     phentermine 15 MG capsule Take 1 tablet by mouth daily.     albuterol (PROAIR HFA) 108 (90 Base) MCG/ACT inhaler ProAir HFA 90 mcg/actuation aerosol inhaler     albuterol (VENTOLIN HFA) 108 (90 Base) MCG/ACT inhaler Inhale into the lungs.     amoxicillin-clavulanate (AUGMENTIN) 500-125 MG tablet amoxicillin 500 mg-potassium clavulanate 125 mg tablet     atorvastatin (LIPITOR) 10 MG tablet atorvastatin 10 mg tablet     Azelastine-Fluticasone (DYMISTA) 137-50 MCG/ACT SUSP Dymista 137 mcg-50 mcg/spray nasal spray     azithromycin (ZITHROMAX) 250 MG tablet azithromycin 250 mg tablet     benzonatate (TESSALON) 100 MG capsule benzonatate 100 mg capsule     cetirizine (ZYRTEC) 10 MG tablet Take 10 mg by mouth daily.     diazepam (VALIUM) 5 MG tablet diazepam 5 mg tablet     fluconazole (DIFLUCAN) 150 MG tablet fluconazole 150 mg tablet     fluticasone (FLONASE) 50 MCG/ACT nasal spray fluticasone propionate 50  mcg/actuation nasal spray,suspension     glimepiride (AMARYL) 2 MG tablet glimepiride 2 mg tablet     HYDROcodone-acetaminophen (NORCO/VICODIN) 5-325 MG tablet hydrocodone 5 mg-acetaminophen 325 mg tablet     ibuprofen (ADVIL,MOTRIN) 100 MG tablet Take 200 mg by mouth.     lamoTRIgine (LAMICTAL) 150 MG tablet Take 75 mg by mouth every morning. Takes 1/2 tablet     lamoTRIgine (LAMICTAL) 150 MG tablet Take 0.5 tablets by mouth 2 (two) times daily.      lansoprazole (PREVACID) 30 MG capsule Take 30 mg by mouth daily at 12 noon.     levonorgestrel (MIRENA, 52 MG,) 20 MCG/DAY IUD Mirena 20 mcg/24 hours (7 yrs) 52 mg intrauterine device  Take 1 insert by intrauterine route.     meloxicam (MOBIC) 15 MG tablet meloxicam 15 mg tablet     metFORMIN (GLUCOPHAGE) 500 MG tablet Take by mouth.     methylphenidate (RITALIN LA) 30 MG 24 hr capsule Take 30 mg by mouth every morning.     naproxen (NAPROSYN) 500 MG tablet naproxen 500 mg tablet     Olopatadine HCl 0.6 % SOLN Place into both nostrils.     Omeprazole Magnesium (PRILOSEC PO) omeprazole     oseltamivir (TAMIFLU) 75 MG capsule Tamiflu 75 mg capsule     pantoprazole (PROTONIX) 40 MG tablet pantoprazole 40 mg tablet,delayed release     penicillin v potassium (VEETID) 500 MG tablet penicillin V potassium 500 mg tablet     promethazine-dextromethorphan (PROMETHAZINE-DM) 6.25-15 MG/5ML syrup promethazine-DM 6.25 mg-15 mg/5 mL oral syrup     topiramate (TOPAMAX) 25 MG tablet topiramate 25 mg tablet     traMADol-acetaminophen (ULTRACET) 37.5-325 MG tablet tramadol 37.5 mg-acetaminophen 325 mg tablet     Vilazodone HCl (VIIBRYD) 20 MG TABS Viibryd 20 mg tablet   1 tablet every day by oral route.     No current facility-administered medications on file prior to visit.    Allergies  Allergen Reactions   Erythromycin Nausea And Vomiting   Sulfa Antibiotics     Other reaction(s): diarrhea   Topamax [Topiramate] Other (See Comments)    Blurred vision    Objective:  General: Alert and oriented x3 in no acute distress  Dermatology: No open lesions bilateral lower extremities, no webspace macerations, no ecchymosis bilateral, all nails x 10 are well manicured.  Vascular: Dorsalis Pedis and Posterior Tibial pedal pulses palpable, Capillary Fill Time 3 seconds,(+) pedal hair growth bilateral, + swelling at left ankle, Temperature gradient within normal limits.  Neurology: Michaell Cowing sensation intact  via light touch bilateral.  Musculoskeletal: + pain to palpation at Achilles insertion, PT tendon course, PF insertion and peroneal tendon course at level of ankle on left. Pain is worsened with end range of motion on left. Tendon feel intact with no gap or dell. No pain to forefoot on left. No significant toe deformity. Strength is acceptable for patient status.   Gait: Antalgic gait  Xrays  Left ankle/foot   Impression:No fracture/dislocation, Joint space narrowing and bone spurs noted at 2nd MTPJ and midfoot, there are also spurs noted to inferior calcaneus.  Assessment and Plan: Problem List Items Addressed This Visit   None Visit Diagnoses     Tendonitis    -  Primary   Relevant Orders   DG Ankle 2 Views Left   Left foot pain       Relevant Orders   DG Foot Complete Left   Left ankle pain, unspecified chronicity  Inflammatory heel pain, left            -Complete examination performed -Xrays reviewed -Discussed treatement options for likely tendonitis -RxPrednisone -Advised patient to d/c mobic while on prednisone -Continue with CAM boot -Continue with rest, ice, elevation and compression; surgigrip sleeve dispensed -Patient to return to office in 2 weeks or sooner if condition worsens.Will do a MRI and lab work if symptoms are not better next week.  Asencion Islam, DPM

## 2020-09-06 ENCOUNTER — Other Ambulatory Visit: Payer: Self-pay | Admitting: Sports Medicine

## 2020-09-06 DIAGNOSIS — M779 Enthesopathy, unspecified: Secondary | ICD-10-CM

## 2020-09-12 ENCOUNTER — Other Ambulatory Visit: Payer: Self-pay

## 2020-09-12 ENCOUNTER — Emergency Department (HOSPITAL_BASED_OUTPATIENT_CLINIC_OR_DEPARTMENT_OTHER)
Admission: EM | Admit: 2020-09-12 | Discharge: 2020-09-12 | Disposition: A | Discharge status: Home / Self Care | Payer: BC Managed Care – PPO | Attending: Emergency Medicine | Admitting: Emergency Medicine

## 2020-09-12 ENCOUNTER — Emergency Department (HOSPITAL_BASED_OUTPATIENT_CLINIC_OR_DEPARTMENT_OTHER): Payer: BC Managed Care – PPO

## 2020-09-12 ENCOUNTER — Encounter (HOSPITAL_BASED_OUTPATIENT_CLINIC_OR_DEPARTMENT_OTHER): Payer: Self-pay

## 2020-09-12 DIAGNOSIS — I4891 Unspecified atrial fibrillation: Secondary | ICD-10-CM | POA: Insufficient documentation

## 2020-09-12 DIAGNOSIS — Z87891 Personal history of nicotine dependence: Secondary | ICD-10-CM | POA: Insufficient documentation

## 2020-09-12 DIAGNOSIS — J45909 Unspecified asthma, uncomplicated: Secondary | ICD-10-CM | POA: Insufficient documentation

## 2020-09-12 DIAGNOSIS — Z7984 Long term (current) use of oral hypoglycemic drugs: Secondary | ICD-10-CM | POA: Diagnosis not present

## 2020-09-12 DIAGNOSIS — E119 Type 2 diabetes mellitus without complications: Secondary | ICD-10-CM | POA: Diagnosis not present

## 2020-09-12 DIAGNOSIS — R079 Chest pain, unspecified: Secondary | ICD-10-CM | POA: Diagnosis present

## 2020-09-12 DIAGNOSIS — Z7901 Long term (current) use of anticoagulants: Secondary | ICD-10-CM | POA: Insufficient documentation

## 2020-09-12 LAB — CBC WITH DIFFERENTIAL/PLATELET
Abs Immature Granulocytes: 0.1 10*3/uL — ABNORMAL HIGH (ref 0.00–0.07)
Basophils Absolute: 0.1 10*3/uL (ref 0.0–0.1)
Basophils Relative: 0 %
Eosinophils Absolute: 0.1 10*3/uL (ref 0.0–0.5)
Eosinophils Relative: 1 %
HCT: 49.6 % — ABNORMAL HIGH (ref 36.0–46.0)
Hemoglobin: 17.1 g/dL — ABNORMAL HIGH (ref 12.0–15.0)
Immature Granulocytes: 1 %
Lymphocytes Relative: 21 %
Lymphs Abs: 2.9 10*3/uL (ref 0.7–4.0)
MCH: 29.6 pg (ref 26.0–34.0)
MCHC: 34.5 g/dL (ref 30.0–36.0)
MCV: 86 fL (ref 80.0–100.0)
Monocytes Absolute: 0.9 10*3/uL (ref 0.1–1.0)
Monocytes Relative: 6 %
Neutro Abs: 9.6 10*3/uL — ABNORMAL HIGH (ref 1.7–7.7)
Neutrophils Relative %: 71 %
Platelets: 386 10*3/uL (ref 150–400)
RBC: 5.77 MIL/uL — ABNORMAL HIGH (ref 3.87–5.11)
RDW: 12.5 % (ref 11.5–15.5)
WBC: 13.6 10*3/uL — ABNORMAL HIGH (ref 4.0–10.5)
nRBC: 0 % (ref 0.0–0.2)

## 2020-09-12 LAB — TSH: TSH: 1.389 u[IU]/mL (ref 0.350–4.500)

## 2020-09-12 LAB — BASIC METABOLIC PANEL
Anion gap: 12 (ref 5–15)
BUN: 20 mg/dL (ref 6–20)
CO2: 23 mmol/L (ref 22–32)
Calcium: 9.9 mg/dL (ref 8.9–10.3)
Chloride: 100 mmol/L (ref 98–111)
Creatinine, Ser: 0.78 mg/dL (ref 0.44–1.00)
GFR, Estimated: >60 mL/min (ref >60)
Glucose, Bld: 173 mg/dL — ABNORMAL HIGH (ref 70–99)
Potassium: 4.3 mmol/L (ref 3.5–5.1)
Sodium: 135 mmol/L (ref 135–145)

## 2020-09-12 LAB — TROPONIN I (HIGH SENSITIVITY): Troponin I (High Sensitivity): 5 ng/L (ref <18)

## 2020-09-12 MED ORDER — DILTIAZEM HCL-DEXTROSE 125-5 MG/125ML-% IV SOLN (PREMIX): 5.0000 mg/h | INTRAVENOUS | Status: DC

## 2020-09-12 MED ORDER — APIXABAN 5 MG PO TABS: 5.0000 mg | ORAL_TABLET | Freq: Two times a day (BID) | ORAL | 0 refills | Status: DC

## 2020-09-12 MED ORDER — APIXABAN 2.5 MG PO TABS
5.0000 mg | ORAL_TABLET | ORAL | Status: AC
  Administered 2020-09-12: 5 mg via ORAL
  Filled 2020-09-12: qty 2

## 2020-09-12 MED ORDER — METOPROLOL TARTRATE 25 MG PO TABS: 50.0000 mg | ORAL_TABLET | Freq: Two times a day (BID) | ORAL | 0 refills | Status: DC

## 2020-09-12 MED ORDER — DILTIAZEM HCL 25 MG/5ML IV SOLN
INTRAVENOUS | Status: AC
  Filled 2020-09-12: qty 5

## 2020-09-12 MED ORDER — DILTIAZEM LOAD VIA INFUSION
20.0000 mg | Freq: Once | INTRAVENOUS | Status: AC
  Administered 2020-09-12: 20 mg via INTRAVENOUS
  Filled 2020-09-12: qty 20

## 2020-09-12 MED ORDER — SODIUM CHLORIDE 0.9 % IV BOLUS
500.0000 mL | Freq: Once | INTRAVENOUS | Status: AC
  Administered 2020-09-12: 500 mL via INTRAVENOUS

## 2020-09-12 MED ORDER — DILTIAZEM HCL-DEXTROSE 125-5 MG/125ML-% IV SOLN (PREMIX)
INTRAVENOUS | Status: AC
  Administered 2020-09-12: 5 mg/h via INTRAVENOUS
  Filled 2020-09-12: qty 125

## 2020-09-12 NOTE — ED Notes (Signed)
RT removed PIVs x2 for pt discharge to home.

## 2020-09-12 NOTE — ED Triage Notes (Signed)
Pt reports chest tightness and heaviness that started at 0100 and associated with SOB

## 2020-09-12 NOTE — ED Provider Notes (Addendum)
[facility] EMERGENCY DEPT Provider Note   CSN: [id number] Arrival date & time: 09/12/20  0349     History Chief Complaint  Patient presents with   Chest Pain    Jasmine Harrell is a 50 y.o. female.  Patient presents to the emergency department for evaluation of chest pain with shortness of breath.  Patient was awakened from sleep by symptoms around 2 AM.  Patient reports heart palpitations and racing heartbeat.  She has never had a history of this.      Past Medical History:  Diagnosis Date   Anxiety    Asthma    exercise-induced; no inhaler use in years   Attention deficit disorder (ADD)    Chronic cough    Complication of anesthesia    hard to wake up post-op   Deviated septum 04/2012   GERD (gastroesophageal reflux disease)    no current med.   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   Disorder of sacrum 09/05/2020   Dysphagia 09/05/2020   Edema 09/05/2020   Encounter for general adult medical examination without abnormal findings 09/05/2020   Excessive thirst 09/05/2020   Gastro-esophageal reflux disease without esophagitis 09/05/2020   Heartburn 09/05/2020   Hypersomnia 09/05/2020   Manic disorder, single episode (HCC) 09/05/2020   Mild bipolar I disorder, single manic episode (HCC) 09/05/2020   Obesity due to excess calories 09/05/2020   Asthma without status asthmaticus 09/05/2020   Osteoarthritis of knee 09/05/2020   Acute bronchitis 09/05/2020   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   Polyuria 09/05/2020   Pruritus 09/05/2020   Reactive depression (situational) 09/05/2020    Rectal bleeding 09/05/2020   Snoring 09/05/2020   Varicose veins of lower extremity 09/05/2020   Vitamin D deficiency 09/05/2020   Type 2 diabetes mellitus without complications (HCC) 09/06/2019   Hoarseness 04/28/2017   Chronic cough 04/22/2017   Nose septum deviation 05/25/2012    Class: Chronic    Past Surgical History:  Procedure Laterality Date   CESAREAN SECTION  10/22/2004; 06/08/2001   ESOPHAGOGASTRODUODENOSCOPY (EGD) WITH PROPOFOL N/A 10/29/2014   Procedure: ESOPHAGOGASTRODUODENOSCOPY (EGD) WITH PROPOFOL;  Surgeon: Charolett Bumpers, MD;  Location: [facility];  Service: Endoscopy;  Laterality: N/A;   NASAL SEPTOPLASTY W/ TURBINOPLASTY Bilateral 05/25/2012   Procedure: NASAL SEPTOPLASTY WITH TURBINATE REDUCTION;  Surgeon: Osborn Coho, MD;  Location: [facility];  Service: ENT;  Laterality: Bilateral;  Nasal Septoplasty with Bilateral Turbinate Reduction   WISDOM TOOTH EXTRACTION       OB History   No obstetric history on file.     History reviewed. No pertinent family history.  Social History   Tobacco Use   Smoking status: Former    Pack years: 0.00    Types: Cigarettes    Quit date: 10/22/1994    Years since quitting: 25.9   Smokeless tobacco: Never   Tobacco comments:    social- college years  Substance Use Topics   Alcohol use: No   Drug use: No    Home Medications Prior to Admission medications   Medication Sig Start Date End Date  Taking? Authorizing Provider  albuterol (PROAIR HFA) 108 (90 Base) MCG/ACT inhaler ProAir HFA 90 mcg/actuation aerosol inhaler    [provider]  albuterol (VENTOLIN HFA) 108 (90 Base) MCG/ACT inhaler Inhale into the lungs. 07/03/20   [provider]  amoxicillin-clavulanate (AUGMENTIN) 500-125 MG tablet amoxicillin 500 mg-potassium clavulanate 125 mg tablet    [provider]  apixaban (ELIQUIS) 5 MG TABS tablet Take 1 tablet (5 mg total) by mouth 2 (two) times daily. 09/12/20 10/12/20   Gilda CreasePollina, Inda Mcglothen J, MD  atorvastatin (LIPITOR) 10 MG tablet atorvastatin 10 mg tablet    [provider]  Azelastine-Fluticasone (DYMISTA) 137-50 MCG/ACT SUSP Dymista 137 mcg-50 mcg/spray nasal spray    [provider]  azithromycin (ZITHROMAX) 250 MG tablet azithromycin 250 mg tablet    [provider]  benzonatate (TESSALON) 100 MG capsule benzonatate 100 mg capsule    [provider]  cetirizine (ZYRTEC) 10 MG tablet Take 10 mg by mouth daily.    [provider]  diazepam (VALIUM) 5 MG tablet diazepam 5 mg tablet    [provider]  fluconazole (DIFLUCAN) 150 MG tablet fluconazole 150 mg tablet    [provider]  fluticasone (FLONASE) 50 MCG/ACT nasal spray fluticasone propionate 50 mcg/actuation nasal spray,suspension    [provider]  fluticasone furoate-vilanterol (BREO ELLIPTA) 200-25 MCG/INH AEPB  10/29/16   [provider]  glimepiride (AMARYL) 2 MG tablet glimepiride 2 mg tablet    [provider]  HYDROcodone-acetaminophen (NORCO/VICODIN) 5-325 MG tablet hydrocodone 5 mg-acetaminophen 325 mg tablet    [provider]  ibuprofen (ADVIL,MOTRIN) 100 MG tablet Take 200 mg by mouth.    [provider]  lamoTRIgine (LAMICTAL) 150 MG tablet Take 75 mg by mouth every morning. Takes 1/2 tablet    [provider]  lamoTRIgine (LAMICTAL) 150 MG tablet Take 0.5 tablets by mouth 2 (two) times daily.    [provider]  lansoprazole (PREVACID) 30 MG capsule Take 30 mg by mouth daily at 12 noon.    [provider]  lansoprazole (PREVACID) 30 MG capsule 1 capsule 05/10/15   [provider]  levocetirizine (XYZAL) 5 MG tablet levocetirizine 5 mg tablet 10/21/16   [provider]  levonorgestrel (MIRENA, 52 MG,) 20 MCG/DAY IUD Mirena 20 mcg/24 hours (7 yrs) 52 mg intrauterine device  Take 1 insert by intrauterine route.    [provider]   meloxicam (MOBIC) 15 MG tablet meloxicam 15 mg tablet    [provider]  metFORMIN (GLUCOPHAGE) 500 MG tablet Take by mouth.    [provider]  methylphenidate (RITALIN LA) 30 MG 24 hr capsule Take 30 mg by mouth every morning.    [provider]  methylphenidate (RITALIN LA) 30 MG 24 hr capsule 1 capsule in the morning 08/30/20   [provider]  naproxen (NAPROSYN) 500 MG tablet naproxen 500 mg tablet    [provider]  Olopatadine HCl 0.6 % SOLN Place into both nostrils. 07/03/20   [provider]  Omeprazole Magnesium (PRILOSEC PO) omeprazole    [provider]  oseltamivir (TAMIFLU) 75 MG capsule Tamiflu 75 mg capsule    [provider]  pantoprazole (PROTONIX) 40 MG tablet pantoprazole 40 mg tablet,delayed release    [provider]  penicillin v potassium (VEETID) 500 MG tablet penicillin V potassium 500 mg tablet    [provider]  phentermine 15 MG capsule Take 1 tablet by mouth daily. 12/18/17  [provider]  predniSONE (STERAPRED UNI-PAK 21 TAB) 10 MG (21) TBPK tablet Take as directed 09/05/20   Asencion Islam, DPM  promethazine-dextromethorphan (PROMETHAZINE-DM) 6.25-15 MG/5ML syrup promethazine-DM 6.25 mg-15 mg/5 mL oral syrup    [provider]  topiramate (TOPAMAX) 25 MG tablet topiramate 25 mg tablet    [provider]  traMADol-acetaminophen (ULTRACET) 37.5-325 MG tablet tramadol 37.5 mg-acetaminophen 325 mg tablet    [provider]  Vilazodone HCl (VIIBRYD) 20 MG TABS Viibryd 20 mg tablet   1 tablet every day by oral route.    [provider]    Allergies    Erythromycin, Sulfa antibiotics, and Topamax [topiramate]  Review of Systems   Review of Systems  Respiratory:  Positive for shortness of breath.   Cardiovascular:  Positive for chest pain and palpitations.  All other systems reviewed and are negative.  Physical Exam Updated  Vital Signs BP 122/88   Pulse 82   Temp 98.3 F (36.8 C) (Oral)   Resp (!) 21   Ht 5\' 6"  (1.676 m)   Wt 86.2 kg   SpO2 96%   BMI 30.67 kg/m   Physical Exam Vitals and nursing note reviewed.  Constitutional:      General: She is in acute distress.     Appearance: Normal appearance. She is well-developed.  HENT:     Head: Normocephalic and atraumatic.     Right Ear: Hearing normal.     Left Ear: Hearing normal.     Nose: Nose normal.  Eyes:     Conjunctiva/sclera: Conjunctivae normal.     Pupils: Pupils are equal, round, and reactive to light.  Cardiovascular:     Rate and Rhythm: Tachycardia present. Rhythm irregular.     Heart sounds: S1 normal and S2 normal. No murmur heard.   No friction rub. No gallop.  Pulmonary:     Effort: Pulmonary effort is normal. No respiratory distress.     Breath sounds: Normal breath sounds.  Chest:     Chest wall: No tenderness.  Abdominal:     General: Bowel sounds are normal.     Palpations: Abdomen is soft.     Tenderness: There is no abdominal tenderness. There is no guarding or rebound. Negative signs include Murphy's sign and McBurney's sign.     Hernia: No hernia is present.  Musculoskeletal:        General: Normal range of motion.     Cervical back: Normal range of motion and neck supple.  Skin:    General: Skin is warm and dry.     Findings: No rash.  Neurological:     Mental Status: She is alert and oriented to person, place, and time.     GCS: GCS eye subscore is 4. GCS verbal subscore is 5. GCS motor subscore is 6.     Cranial Nerves: No cranial nerve deficit.     Sensory: No sensory deficit.     Coordination: Coordination normal.  Psychiatric:        Speech: Speech normal.        Behavior: Behavior normal.        Thought Content: Thought content normal.    ED Results / Procedures / Treatments   Labs (all labs ordered are listed, but only abnormal results are displayed) Labs Reviewed  CBC WITH  DIFFERENTIAL/PLATELET - Abnormal; Notable for the following components:      Result Value   WBC 13.6 (*)    RBC 5.77 (*)  Hemoglobin 17.1 (*)    HCT 49.6 (*)    Neutro Abs 9.6 (*)    Abs Immature Granulocytes 0.10 (*)    All other components within normal limits  BASIC METABOLIC PANEL - Abnormal; Notable for the following components:   Glucose, Bld 173 (*)    All other components within normal limits  TSH  TROPONIN I (HIGH SENSITIVITY)  TROPONIN I (HIGH SENSITIVITY)    EKG None   Radiology DG Chest Port 1 View  Result Date: 09/12/2020 CLINICAL DATA:  50 year old female with chest pain and chest tightness with shortness of breath since 0100 hours. EXAM: PORTABLE CHEST 1 VIEW COMPARISON:  Chest radiograph 09/19/2015 and earlier. FINDINGS: Portable AP semi upright view at 0419 hours. Mildly lower lung volumes. Mediastinal contours remain normal. Visualized tracheal air column is within normal limits. Allowing for portable technique the lungs are clear. No pneumothorax or pleural effusion. No acute osseous abnormality identified. Paucity of bowel gas in the upper abdomen. IMPRESSION: Lower lung volumes.  No acute cardiopulmonary abnormality. Electronically Signed   By: Odessa Fleming M.D.   On: 09/12/2020 04:42    Procedures Procedures   Medications Ordered in ED Medications  diltiazem (CARDIZEM) 1 mg/mL load via infusion 20 mg (20 mg Intravenous Bolus from Bag 09/12/20 0415)    And  diltiazem (CARDIZEM) 125 mg in dextrose 5% 125 mL (1 mg/mL) infusion (0 mg/hr Intravenous Stopped 09/12/20 0439)  apixaban (ELIQUIS) tablet 5 mg (has no administration in time range)  sodium chloride 0.9 % bolus 500 mL (500 mLs Intravenous New Bag/Given 09/12/20 0424)    ED Course  I have reviewed the triage vital signs and the nursing notes.  Pertinent labs & imaging results that were available during my care of the patient were reviewed by me and considered in my medical decision making (see chart for  details).    MDM Rules/Calculators/A&P                          Patient presents to the emergency department with chest pain, heart palpitations and shortness of breath.  Patient is in atrial fibrillation with a rapid ventricular response at arrival.   CHA2DS2-VASc Score = 2  This indicates a 2.2% annual risk of stroke. The patient's score is based upon: CHF History: No HTN History: No Diabetes History: Yes Stroke History: No Vascular Disease History: No Age Score: 0 Gender Score: 1       Patient administered a Cardizem bolus and started on a Cardizem drip.  She quickly converted to sinus rhythm.  Symptoms resolved when she regained sinus rhythm.  Work-up otherwise unremarkable.  Patient will be appropriate for outpatient follow-up with atrial fibrillation clinic.  CHA2DS2-VASc score is 2, will initiate Eliquis.    Final Clinical Impression(s) / ED Diagnoses Final diagnoses:  Atrial fibrillation with RVR (HCC)    Rx / DC Orders ED Discharge Orders          Ordered    apixaban (ELIQUIS) 5 MG TABS tablet  2 times daily,   Status:  Discontinued        09/12/20 0457    apixaban (ELIQUIS) 5 MG TABS tablet  2 times daily        09/12/20 0533             Gilda Crease, MD 09/12/20 9622    Gilda Crease, MD 09/12/20 [id number]

## 2020-09-12 NOTE — ED Notes (Signed)
RT placed PIV in right forearm without difficulty. Flushed, saline locked and secured.

## 2020-09-16 NOTE — Progress Notes (Signed)
Primary Care Physician: Marden Noble, MD Primary Cardiologist: none Primary Electrophysiologist: none Referring Physician: [facility]    Jasmine Harrell is a 50 y.o. female with a history of OSA, DM, ADHD, bipolar, and atrial fibrillation who presents for consultation in the [facility] Atrial Fibrillation [facility].  The patient was initially diagnosed with atrial fibrillation 09/12/20 after presenting to the ED with symptoms of palpitations, CP, and SOB. This occurred in the middle of the night and woke her from sleep. She was given a diltiazem bolus and drip in the ED which quickly converted her to SR. Patient has a CHADS2VASC score of 2. She does have OSA and has been compliant with her CPAP. She was on a steroid taper just prior to the onset of her symptoms.   Today, she denies symptoms of palpitations, chest pain, shortness of breath, orthopnea, PND, lower extremity edema, dizziness, presyncope, syncope, snoring, daytime somnolence, bleeding, or neurologic sequela. The patient is tolerating medications without difficulties and is otherwise without complaint today.    Atrial Fibrillation Risk Factors:  she does have symptoms or diagnosis of sleep apnea. she is compliant with CPAP therapy. she does not have a history of rheumatic fever. she does not have a history of alcohol use. The patient does not have a history of early familial atrial fibrillation or other arrhythmias.  she has a BMI of Body mass index is 32.93 kg/m.Marland Kitchen Filed Weights   09/17/20 0910  Weight: 92.5 kg    No family history on file.   Atrial Fibrillation Management history:  Previous antiarrhythmic drugs: none Previous cardioversions: none Previous ablations: none CHADS2VASC score: 2 Anticoagulation history: Eliquis   Past Medical History:  Diagnosis Date   Anxiety    Asthma    exercise-induced; no inhaler use in years   Attention deficit disorder (ADD)    Chronic cough    Complication  of anesthesia    hard to wake up post-op   Deviated septum 04/2012   GERD (gastroesophageal reflux disease)    no current med.   H/O seasonal allergies    Headache(784.0)    sinus 1 x/week; migraine 1 x/month   Needle phobia    hyperventilates with needle sticks   Sleep apnea    cpap used   Past Surgical History:  Procedure Laterality Date   CESAREAN SECTION  10/22/2004; 06/08/2001   ESOPHAGOGASTRODUODENOSCOPY (EGD) WITH PROPOFOL N/A 10/29/2014   Procedure: ESOPHAGOGASTRODUODENOSCOPY (EGD) WITH PROPOFOL;  Surgeon: Charolett Bumpers, MD;  Location: [facility];  Service: Endoscopy;  Laterality: N/A;   NASAL SEPTOPLASTY W/ TURBINOPLASTY Bilateral 05/25/2012   Procedure: NASAL SEPTOPLASTY WITH TURBINATE REDUCTION;  Surgeon: Osborn Coho, MD;  Location: [facility];  Service: ENT;  Laterality: Bilateral;  Nasal Septoplasty with Bilateral Turbinate Reduction   WISDOM TOOTH EXTRACTION      Current Outpatient Medications  Medication Sig Dispense Refill   acetaminophen (TYLENOL) 500 MG tablet Take 500 mg by mouth every 6 (six) hours as needed for moderate pain or mild pain.     albuterol (VENTOLIN HFA) 108 (90 Base) MCG/ACT inhaler ProAir HFA 90 mcg/actuation aerosol inhaler     apixaban (ELIQUIS) 5 MG TABS tablet Take 1 tablet (5 mg total) by mouth 2 (two) times daily. 60 tablet 0   atorvastatin (LIPITOR) 10 MG tablet atorvastatin 10 mg tablet     Azelastine-Fluticasone 137-50 MCG/ACT SUSP Dymista 137 mcg-50 mcg/spray nasal spray     Dextromethorphan-guaiFENesin (CORICIDIN HBP CONGESTION/COUGH PO) Take by mouth.  fluticasone (FLONASE) 50 MCG/ACT nasal spray fluticasone propionate 50 mcg/actuation nasal spray,suspension     fluticasone furoate-vilanterol (BREO ELLIPTA) 200-25 MCG/INH AEPB      glimepiride (AMARYL) 2 MG tablet glimepiride 2 mg tablet     HYDROcodone-acetaminophen (NORCO/VICODIN) 5-325 MG tablet hydrocodone 5 mg-acetaminophen 325 mg tablet     lamoTRIgine  (LAMICTAL) 150 MG tablet Take 0.5 tablets by mouth 2 (two) times daily.     lansoprazole (PREVACID) 30 MG capsule Take 30 mg by mouth daily at 12 noon.     levocetirizine (XYZAL) 5 MG tablet levocetirizine 5 mg tablet     levonorgestrel (MIRENA, 52 MG,) 20 MCG/DAY IUD Mirena 20 mcg/24 hours (7 yrs) 52 mg intrauterine device  Take 1 insert by intrauterine route.     metFORMIN (GLUCOPHAGE) 500 MG tablet Take 500 mg by mouth 2 (two) times daily.     methylphenidate (RITALIN LA) 30 MG 24 hr capsule 1 capsule in the morning     metoprolol tartrate (LOPRESSOR) 25 MG tablet Take 2 tablets (50 mg total) by mouth 2 (two) times daily. 60 tablet 0   No current facility-administered medications for this encounter.    Allergies  Allergen Reactions   Erythromycin Nausea And Vomiting   Sulfa Antibiotics     Other reaction(s): diarrhea   Topamax [Topiramate] Other (See Comments)    Blurred vision    Social History   Socioeconomic History   Marital status: Married    Spouse name: Not on file   Number of children: Not on file   Years of education: Not on file   Highest education level: Not on file  Occupational History   Not on file  Tobacco Use   Smoking status: Former    Pack years: 0.00    Types: Cigarettes    Quit date: 10/22/1994    Years since quitting: 25.9   Smokeless tobacco: Never   Tobacco comments:    social- college years  Substance and Sexual Activity   Alcohol use: No   Drug use: No   Sexual activity: Yes    Birth control/protection: I.U.D.  Other Topics Concern   Not on file  Social History Narrative   Not on file   Social Determinants of Health   Financial Resource Strain: Not on file  Food Insecurity: Not on file  Transportation Needs: Not on file  Physical Activity: Not on file  Stress: Not on file  Social Connections: Not on file  Intimate Partner Violence: Not on file     ROS- All systems are reviewed and negative except as per the HPI  above.  Physical Exam: Vitals:   09/17/20 0910  BP: 114/74  Pulse: 63  Weight: 92.5 kg  Height: 5\' 6"  (1.676 m)    GEN- The patient is a well appearing obese female, alert and oriented x 3 today.   Head- normocephalic, atraumatic Eyes-  Sclera clear, conjunctiva pink Ears- hearing intact Oropharynx- clear Neck- supple  Lungs- Clear to ausculation bilaterally, normal work of breathing Heart- Regular rate and rhythm, no murmurs, rubs or gallops  GI- soft, NT, ND, + BS Extremities- no clubbing, cyanosis, or edema MS- no significant deformity or atrophy Skin- no rash or lesion Psych- euthymic mood, full affect Neuro- strength and sensation are intact  Wt Readings from Last 3 Encounters:  09/17/20 92.5 kg  09/12/20 86.2 kg  10/19/17 96.2 kg    EKG today demonstrates  SR Vent. rate 63 BPM PR interval 138 ms QRS duration  82 ms QT/QTcB 388/397 ms  Epic records are reviewed at length today  CHA2DS2-VASc Score = 2  The patient's score is based upon: CHF History: No HTN History: No Diabetes History: Yes Stroke History: No Vascular Disease History: No Age Score: 0 Gender Score: 1      ASSESSMENT AND PLAN: 1. Paroxysmal Atrial Fibrillation (ICD10:  I48.0) The patient's CHA2DS2-VASc score is 2, indicating a 2.2% annual risk of stroke.   Suspect 2/2 steroid. General education about afib provided and questions answered. We also discussed her stroke risk and the risks and benefits of anticoagulation. With shared decision making, patient would like to continue anticoagulation for now. It would be reasonable to discontinue with CV score < 3.   Check echocardiogram Continue Lopressor 50 mg BID   2. Obesity Body mass index is 32.93 kg/m. Lifestyle modification was discussed at length including regular exercise and weight reduction.  3. Obstructive sleep apnea The importance of adequate treatment of sleep apnea was discussed today in order to improve our ability to  maintain sinus rhythm long term. Patient reports compliance with CPAP therapy.    Follow up in the AF clinic in one month.    Jorja Loa PA-C Afib Clinic [facility] [address] [place], [place] [address] [phone] 09/17/2020 9:17 AM

## 2020-09-17 ENCOUNTER — Other Ambulatory Visit: Payer: Self-pay

## 2020-09-17 ENCOUNTER — Encounter (HOSPITAL_COMMUNITY): Payer: Self-pay | Admitting: Physician Assistant

## 2020-09-17 ENCOUNTER — Ambulatory Visit: Payer: BC Managed Care – PPO | Admitting: Podiatry

## 2020-09-17 ENCOUNTER — Ambulatory Visit (HOSPITAL_COMMUNITY)
Admission: RE | Admit: 2020-09-17 | Discharge: 2020-09-17 | Disposition: A | Discharge status: Home / Self Care | Payer: BC Managed Care – PPO | Source: Ambulatory Visit | Attending: Physician Assistant | Admitting: Physician Assistant

## 2020-09-17 VITALS — BP 114/74 | HR 63 | Ht 66.0 in | Wt 204.0 lb

## 2020-09-17 DIAGNOSIS — E119 Type 2 diabetes mellitus without complications: Secondary | ICD-10-CM | POA: Diagnosis not present

## 2020-09-17 DIAGNOSIS — Z87891 Personal history of nicotine dependence: Secondary | ICD-10-CM | POA: Insufficient documentation

## 2020-09-17 DIAGNOSIS — Z888 Allergy status to other drugs, medicaments and biological substances status: Secondary | ICD-10-CM | POA: Insufficient documentation

## 2020-09-17 DIAGNOSIS — G4733 Obstructive sleep apnea (adult) (pediatric): Secondary | ICD-10-CM | POA: Insufficient documentation

## 2020-09-17 DIAGNOSIS — M25872 Other specified joint disorders, left ankle and foot: Secondary | ICD-10-CM

## 2020-09-17 DIAGNOSIS — Z882 Allergy status to sulfonamides status: Secondary | ICD-10-CM | POA: Insufficient documentation

## 2020-09-17 DIAGNOSIS — E669 Obesity, unspecified: Secondary | ICD-10-CM | POA: Insufficient documentation

## 2020-09-17 DIAGNOSIS — Z881 Allergy status to other antibiotic agents status: Secondary | ICD-10-CM | POA: Insufficient documentation

## 2020-09-17 DIAGNOSIS — Z6832 Body mass index (BMI) 32.0-32.9, adult: Secondary | ICD-10-CM | POA: Insufficient documentation

## 2020-09-17 DIAGNOSIS — I480 Paroxysmal atrial fibrillation: Secondary | ICD-10-CM | POA: Diagnosis present

## 2020-09-17 DIAGNOSIS — Z7984 Long term (current) use of oral hypoglycemic drugs: Secondary | ICD-10-CM | POA: Diagnosis not present

## 2020-09-17 DIAGNOSIS — Z79899 Other long term (current) drug therapy: Secondary | ICD-10-CM | POA: Insufficient documentation

## 2020-09-17 DIAGNOSIS — F319 Bipolar disorder, unspecified: Secondary | ICD-10-CM | POA: Insufficient documentation

## 2020-09-17 DIAGNOSIS — Z7901 Long term (current) use of anticoagulants: Secondary | ICD-10-CM | POA: Insufficient documentation

## 2020-09-17 MED ORDER — METOPROLOL TARTRATE 50 MG PO TABS: 50.0000 mg | ORAL_TABLET | Freq: Two times a day (BID) | ORAL | 3 refills | Status: DC

## 2020-09-19 NOTE — Progress Notes (Signed)
  Subjective:  Patient ID: Jasmine Harrell, female    DOB: 10/27/1970,  MRN: [id number]  Chief Complaint  Patient presents with   Foot Pain    Follow up left foot and ankle pain. Pt states pain is unchanged. Pt was not able to take Prednisone due to being placed in the ER and having A-Fib because of the Rx. Pt states PCP also recommended her to not take Meloxicam.     50 y.o. female presents with the above complaint. History confirmed with patient.  Achilles is better.  Most of the pain is now under the big toe  Objective:  Physical Exam: warm, good capillary refill, no trophic changes or ulcerative lesions, normal DP and PT pulses, and normal sensory exam. Left Foot: Pain on palpation of the sesamoid complex Assessment:   1. Sesamoiditis of left foot      Plan:  Patient was evaluated and treated and all questions answered.  Recommend she remain in CAM boot for 4 more weeks for sesamoiditis.  Hopefully should resolve in that time period.  Recommended Voltaren gel.  Avoid NSAIDs and prednisone for now.  If not improved by next visit recommend MRI  Return in about 4 weeks (around 10/15/2020) for re-check sesamoiditis.

## 2020-10-04 ENCOUNTER — Other Ambulatory Visit: Payer: Self-pay

## 2020-10-04 ENCOUNTER — Ambulatory Visit (HOSPITAL_COMMUNITY)
Admission: RE | Admit: 2020-10-04 | Discharge: 2020-10-04 | Disposition: A | Discharge status: Home / Self Care | Payer: BC Managed Care – PPO | Source: Ambulatory Visit | Attending: Physician Assistant | Admitting: Physician Assistant

## 2020-10-04 DIAGNOSIS — I480 Paroxysmal atrial fibrillation: Secondary | ICD-10-CM | POA: Diagnosis present

## 2020-10-04 DIAGNOSIS — E119 Type 2 diabetes mellitus without complications: Secondary | ICD-10-CM | POA: Insufficient documentation

## 2020-10-04 LAB — ECHOCARDIOGRAM COMPLETE
AR max vel: 2.16 cm2
AV Area VTI: 2.3 cm2
AV Area mean vel: 2.09 cm2
AV Mean grad: 7 mmHg
AV Peak grad: 12 mmHg
Ao pk vel: 1.73 m/s
Area-P 1/2: 2.83 cm2
S' Lateral: 3.1 cm

## 2020-10-04 NOTE — Progress Notes (Signed)
  Echocardiogram 2D Echocardiogram has been performed.  Jasmine Harrell 10/04/2020, 3:17 PM

## 2020-10-15 ENCOUNTER — Other Ambulatory Visit: Payer: Self-pay

## 2020-10-15 ENCOUNTER — Ambulatory Visit: Payer: BC Managed Care – PPO | Admitting: Podiatry

## 2020-10-15 DIAGNOSIS — M79672 Pain in left foot: Secondary | ICD-10-CM | POA: Diagnosis not present

## 2020-10-15 DIAGNOSIS — M25872 Other specified joint disorders, left ankle and foot: Secondary | ICD-10-CM | POA: Diagnosis not present

## 2020-10-15 DIAGNOSIS — M779 Enthesopathy, unspecified: Secondary | ICD-10-CM

## 2020-10-15 NOTE — Patient Instructions (Signed)
Posterior Tibial Tendinitis  Posterior tibial tendinitis is irritation of a tendon called the posterior tibial tendon. Your posterior tibial tendon is a cord-like tissue that connects bones of your lower leg and foot to a muscle that: Supports your arch. Helps you raise up on your toes. Helps you turn your foot down and in. This condition causes foot and ankle pain. It can also lead to a flat foot. What are the causes? This condition is most often caused by repeated stress to the tendon (overuse injury). It can also be caused by a sudden injury that stresses the tendon, such as landing on your foot after jumping or falling. What increases the risk? This condition is more likely to develop in: People who play a sport that involves putting a lot of pressure on the feet, such as: Basketball. Tennis. Soccer. Hockey. Runners. Females who are older than 50 years of age and are overweight. People with diabetes. People with decreased foot stability. People with flat feet. What are the signs or symptoms? Symptoms include: Pain in the inner ankle. Pain at the arch of your foot. Pain that gets worse with running, walking, or standing. Swelling on the inside of your ankle and foot. Weakness in your ankle or foot. Inability to stand up on tiptoe. Flattening of the arch of your foot. How is this diagnosed? This condition may be diagnosed based on: Your symptoms. Your medical history. A physical exam. Tests, such as: X-ray. MRI. Ultrasound. How is this treated? This condition may be treated by: Putting ice to the injured area. Taking NSAIDs, such as ibuprofen, to reduce pain and swelling. Wearing a special shoe or shoe insert to support your arch (orthotic). Having physical therapy. Replacing high-impact exercise with low-impact exercise, such as swimming or cycling. If your symptoms do not improve with these treatments, you may need to wear a splint, removable walking boot, or short  leg cast for 6-8 weeks to keep your foot and ankle still (immobilized). Follow these instructions at home: If you have a cast, splint, or boot: Keep it clean and dry. Check the skin around it every day. Tell your health care provider about any concerns. If you have a cast: Do not stick anything inside it to scratch your skin. Doing that increases your risk of infection. You may put lotion on dry skin around the edges of the cast. Do not put lotion on the skin underneath the cast. If you have a splint or boot: Wear it as told by your health care provider. Remove it only as told by your health care provider. Loosen it if your toes tingle, become numb, or turn cold and blue. Bathing Do not take baths, swim, or use a hot tub until your health care provider approves. Ask your health care provider if you may take showers. If your cast, splint, or boot is not waterproof: Do not let it get wet. Cover it with a waterproof covering while you take a bath or a shower. Managing pain and swelling   If directed, put ice on the injured area. If you have a removable splint or boot, remove it as told by your health care provider. Put ice in a plastic bag. Place a towel between your skin and the bag or between your cast and the bag. Leave the ice on for 20 minutes, 2-3 times a day. Move your toes often to reduce stiffness and swelling. Raise (elevate) the injured area above the level of your heart while you are sitting   or lying down. Activity Do not use the injured foot to support your body weight until your health care provider says that you can. Use crutches as told by your health care provider. Do not do activities that make pain or swelling worse. Ask your health care provider when it is safe to drive if you have a cast, splint, or boot on your foot. Return to your normal activities as told by your health care provider. Ask your health care provider what activities are safe for you. Do exercises as  told by your health care provider. General instructions Take over-the-counter and prescription medicines only as told by your health care provider. If you have an orthotic, use it as told by your health care provider. Keep all follow-up visits as told by your health care provider. This is important. How is this prevented? Wear footwear that is appropriate to your athletic activity. Avoid athletic activities that cause pain or swelling in your ankle or foot. Before being active, do range-of-motion and stretching exercises. If you develop pain or swelling while training, stop training. If you have pain or swelling that does not improve after a few days of rest, see your health care provider. If you start a new athletic activity, start gradually so you can build up your strength and flexibility. Contact a health care provider if: Your symptoms get worse. Your symptoms do not improve in 6-8 weeks. You develop new, unexplained symptoms. Your splint, boot, or cast gets damaged. Summary Posterior tibial tendinitis is irritation of a tendon called the posterior tibial tendon. This condition is most often caused by repeated stress to the tendon (overuse injury). This condition causes foot pain and ankle pain. It can also lead to a flat foot. This condition may be treated by not doing high-impact activities, applying ice, having physical therapy, wearing orthotics, and wearing a cast, splint, or boot if needed. This information is not intended to replace advice given to you by your health care provider. Make sure you discuss any questions you have with your health care provider. Document Revised: 07/12/2018 Document Reviewed: 05/19/2018 Elsevier Patient Education  2020 [employer]  Posterior Tibial Tendinitis Rehab Ask your health care provider which exercises are safe for you. Do exercises exactly as told by your health care provider and adjust them as directed. It is normal to feel mild  stretching, pulling, tightness, or discomfort as you do these exercises. Stop right away if you feel sudden pain or your pain gets worse. Do not begin these exercises until told by your health care provider. Stretching and range-of-motion exercises These exercises warm up your muscles and joints and improve the movement and flexibility in your ankle and foot. These exercises may also help to relieve pain. Standing wall calf stretch, knee straight   Stand with your hands against a wall. Extend your left / right leg behind you, and bend your front knee slightly. If directed, place a folded washcloth under the arch of your foot for support. Point the toes of your back foot slightly inward. Keeping your heels on the floor and your back knee straight, shift your weight toward the wall. Do not allow your back to arch. You should feel a gentle stretch in your upper left / right calf. Hold this position for 10 seconds. Repeat 10 times. Complete this exercise 2 times a day. Standing wall calf stretch, knee bent Stand with your hands against a wall. Extend your left / right leg behind you, and bend your front   knee slightly. If directed, place a folded washcloth under the arch of your foot for support. Point the toes of your back foot slightly inward. Unlock your back knee so it is bent. Keep your heels on the floor. You should feel a gentle stretch deep in your lower left / right calf. Hold this position for 10 seconds. Repeat 10 times. Complete this exercise 2 times a day. Strengthening exercises These exercises build strength and endurance in your ankle and foot. Endurance is the ability to use your muscles for a long time, even after they get tired. Ankle inversion with band Secure one end of a rubber exercise band or tubing to a fixed object, such as a table leg or a pole, that will stay still when the band is pulled. Loop the other end of the band around the middle of your left / right foot. Sit  on the floor facing the object with your left / right leg extended. The band or tube should be slightly tense when your foot is relaxed. Leading with your big toe, slowly bring your left / right foot and ankle inward, toward your other foot (inversion). Hold this position for 10 seconds. Slowly return your foot to the starting position. Repeat 10 times. Complete this exercise 2 times a day. Towel curls   Sit in a chair on a non-carpeted surface, and put your feet on the floor. Place a towel in front of your feet. Keeping your heel on the floor, put your left / right foot on the towel. Pull the towel toward you by grabbing the towel with your toes and curling them under. Keep your heel on the floor while you do this. Let your toes relax. Grab the towel with your toes again. Keep going until the towel is completely underneath your foot. Repeat 10 times. Complete this exercise 2 times a day. Balance exercise This exercise improves or maintains your balance. Balance is important in preventing falls. Single leg stand Without wearing shoes, stand near a railing or in a doorway. You may hold on to the railing or door frame as needed for balance. Stand on your left / right foot. Keep your big toe down on the floor and try to keep your arch lifted. If balancing in this position is too easy, try the exercise with your eyes closed or while standing on a pillow. Hold this position for 10 seconds. Repeat 10 times. Complete this exercise 2 times a day. This information is not intended to replace advice given to you by your health care provider. Make sure you discuss any questions you have with your health care provider.  

## 2020-10-16 ENCOUNTER — Encounter (HOSPITAL_COMMUNITY): Payer: Self-pay | Admitting: Physician Assistant

## 2020-10-16 ENCOUNTER — Ambulatory Visit (HOSPITAL_COMMUNITY)
Admission: RE | Admit: 2020-10-16 | Discharge: 2020-10-16 | Disposition: A | Discharge status: Home / Self Care | Payer: BC Managed Care – PPO | Source: Ambulatory Visit | Attending: Physician Assistant | Admitting: Physician Assistant

## 2020-10-16 VITALS — BP 98/62 | HR 68 | Ht 66.0 in | Wt 208.0 lb

## 2020-10-16 DIAGNOSIS — Z886 Allergy status to analgesic agent status: Secondary | ICD-10-CM | POA: Insufficient documentation

## 2020-10-16 DIAGNOSIS — Z6833 Body mass index (BMI) 33.0-33.9, adult: Secondary | ICD-10-CM | POA: Diagnosis not present

## 2020-10-16 DIAGNOSIS — E669 Obesity, unspecified: Secondary | ICD-10-CM | POA: Diagnosis not present

## 2020-10-16 DIAGNOSIS — Z7984 Long term (current) use of oral hypoglycemic drugs: Secondary | ICD-10-CM | POA: Insufficient documentation

## 2020-10-16 DIAGNOSIS — Z713 Dietary counseling and surveillance: Secondary | ICD-10-CM | POA: Insufficient documentation

## 2020-10-16 DIAGNOSIS — Z7901 Long term (current) use of anticoagulants: Secondary | ICD-10-CM | POA: Insufficient documentation

## 2020-10-16 DIAGNOSIS — I480 Paroxysmal atrial fibrillation: Secondary | ICD-10-CM | POA: Diagnosis present

## 2020-10-16 DIAGNOSIS — Z87891 Personal history of nicotine dependence: Secondary | ICD-10-CM | POA: Insufficient documentation

## 2020-10-16 DIAGNOSIS — Z79899 Other long term (current) drug therapy: Secondary | ICD-10-CM | POA: Insufficient documentation

## 2020-10-16 DIAGNOSIS — G4733 Obstructive sleep apnea (adult) (pediatric): Secondary | ICD-10-CM | POA: Diagnosis not present

## 2020-10-16 NOTE — Progress Notes (Signed)
Primary Care Physician: Marden Noble, MD Primary Cardiologist: none Primary Electrophysiologist: none Referring Physician: [facility]    Jasmine Harrell is a 50 y.o. female with a history of OSA, DM, ADHD, bipolar, and atrial fibrillation who presents for follow up in the [facility]. The patient was initially diagnosed with atrial fibrillation 09/12/20 after presenting to the ED with symptoms of palpitations, CP, and SOB. This occurred in the middle of the night and woke her from sleep. She was given a diltiazem bolus and drip in the ED which quickly converted her to SR. Patient has a CHADS2VASC score of 2. She does have OSA and has been compliant with her CPAP. She was on a steroid taper just prior to the onset of her symptoms.   On follow up today, patient reports that she has done well since her last visit. She denies any heart racing or palpitations. She denies any bleeding issues on anticoagulation. She had an echo which showed EF 60-65%.  Today, she denies symptoms of palpitations, chest pain, shortness of breath, orthopnea, PND, lower extremity edema, dizziness, presyncope, syncope, bleeding, or neurologic sequela. The patient is tolerating medications without difficulties and is otherwise without complaint today.    Atrial Fibrillation Risk Factors:  she does have symptoms or diagnosis of sleep apnea. she is compliant with CPAP therapy. she does not have a history of rheumatic fever. she does not have a history of alcohol use. The patient does not have a history of early familial atrial fibrillation or other arrhythmias.  she has a BMI of Body mass index is 33.57 kg/m.Marland Kitchen Filed Weights   10/16/20 1057  Weight: 94.3 kg     No family history on file.   Atrial Fibrillation Management history:  Previous antiarrhythmic drugs: none Previous cardioversions: none Previous ablations: none CHADS2VASC score: 2 Anticoagulation history:  Eliquis   Past Medical History:  Diagnosis Date   Anxiety    Asthma    exercise-induced; no inhaler use in years   Attention deficit disorder (ADD)    Chronic cough    Complication of anesthesia    hard to wake up post-op   Deviated septum 04/2012   GERD (gastroesophageal reflux disease)    no current med.   H/O seasonal allergies    Headache(784.0)    sinus 1 x/week; migraine 1 x/month   Needle phobia    hyperventilates with needle sticks   Sleep apnea    cpap used   Past Surgical History:  Procedure Laterality Date   CESAREAN SECTION  10/22/2004; 06/08/2001   ESOPHAGOGASTRODUODENOSCOPY (EGD) WITH PROPOFOL N/A 10/29/2014   Procedure: ESOPHAGOGASTRODUODENOSCOPY (EGD) WITH PROPOFOL;  Surgeon: Charolett Bumpers, MD;  Location: [facility];  Service: Endoscopy;  Laterality: N/A;   NASAL SEPTOPLASTY W/ TURBINOPLASTY Bilateral 05/25/2012   Procedure: NASAL SEPTOPLASTY WITH TURBINATE REDUCTION;  Surgeon: Osborn Coho, MD;  Location: [facility];  Service: ENT;  Laterality: Bilateral;  Nasal Septoplasty with Bilateral Turbinate Reduction   WISDOM TOOTH EXTRACTION      Current Outpatient Medications  Medication Sig Dispense Refill   acetaminophen (TYLENOL) 500 MG tablet Take 500 mg by mouth every 6 (six) hours as needed for moderate pain or mild pain.     albuterol (VENTOLIN HFA) 108 (90 Base) MCG/ACT inhaler ProAir HFA 90 mcg/actuation aerosol inhaler     atorvastatin (LIPITOR) 10 MG tablet atorvastatin 10 mg tablet     Azelastine-Fluticasone 137-50 MCG/ACT SUSP Dymista 137 mcg-50 mcg/spray nasal  spray     fluticasone (FLONASE) 50 MCG/ACT nasal spray fluticasone propionate 50 mcg/actuation nasal spray,suspension     glimepiride (AMARYL) 2 MG tablet glimepiride 2 mg tablet     lamoTRIgine (LAMICTAL) 150 MG tablet Take 0.5 tablets by mouth 2 (two) times daily.     lansoprazole (PREVACID) 30 MG capsule Take 30 mg by mouth daily at 12 noon.     levocetirizine (XYZAL) 5 MG  tablet levocetirizine 5 mg tablet     levonorgestrel (MIRENA, 52 MG,) 20 MCG/DAY IUD Mirena 20 mcg/24 hours (7 yrs) 52 mg intrauterine device  Take 1 insert by intrauterine route.     metFORMIN (GLUCOPHAGE) 500 MG tablet Take 500 mg by mouth 2 (two) times daily.     methylphenidate (RITALIN LA) 30 MG 24 hr capsule 1 capsule in the morning     metoprolol tartrate (LOPRESSOR) 50 MG tablet Take 1 tablet (50 mg total) by mouth 2 (two) times daily. 60 tablet 3   No current facility-administered medications for this encounter.    Allergies  Allergen Reactions   Erythromycin Nausea And Vomiting   Sulfa Antibiotics     Other reaction(s): diarrhea   Topamax [Topiramate] Other (See Comments)    Blurred vision    Social History   Socioeconomic History   Marital status: Married    Spouse name: Not on file   Number of children: Not on file   Years of education: Not on file   Highest education level: Not on file  Occupational History   Not on file  Tobacco Use   Smoking status: Former    Types: Cigarettes    Quit date: 10/22/1994    Years since quitting: 26.0   Smokeless tobacco: Never   Tobacco comments:    social- college years  Substance and Sexual Activity   Alcohol use: No   Drug use: No   Sexual activity: Yes    Birth control/protection: I.U.D.  Other Topics Concern   Not on file  Social History Narrative   Not on file   Social Determinants of Health   Financial Resource Strain: Not on file  Food Insecurity: Not on file  Transportation Needs: Not on file  Physical Activity: Not on file  Stress: Not on file  Social Connections: Not on file  Intimate Partner Violence: Not on file     ROS- All systems are reviewed and negative except as per the HPI above.  Physical Exam: Vitals:   10/16/20 1057  BP: 98/62  Pulse: 68  Weight: 94.3 kg  Height: 5\' 6"  (1.676 m)    GEN- The patient is a well appearing obese female, alert and oriented x 3 today.   HEENT-head  normocephalic, atraumatic, sclera clear, conjunctiva pink, hearing intact, trachea midline. Lungs- Clear to ausculation bilaterally, normal work of breathing Heart- Regular rate and rhythm, no murmurs, rubs or gallops  GI- soft, NT, ND, + BS Extremities- no clubbing, cyanosis, or edema MS- no significant deformity or atrophy Skin- no rash or lesion Psych- euthymic mood, full affect Neuro- strength and sensation are intact   Wt Readings from Last 3 Encounters:  10/16/20 94.3 kg  09/17/20 92.5 kg  09/12/20 86.2 kg    EKG today demonstrates  SR Vent. rate 68 BPM PR interval 140 ms QRS duration 80 ms QT/QTcB 410/435 ms  Echo 10/04/20  1. Left ventricular ejection fraction, by estimation, is 60 to 65%. The  left ventricle has normal function. The left ventricle has no regional  wall motion abnormalities. Left ventricular diastolic parameters were  normal.   2. Right ventricular systolic function is normal. The right ventricular  size is normal. There is normal pulmonary artery systolic pressure.   3. Left atrial size was mildly dilated.   4. The mitral valve is normal in structure. No evidence of mitral valve  regurgitation. No evidence of mitral stenosis.   5. The aortic valve is grossly normal. Aortic valve regurgitation is not  visualized. No aortic stenosis is present.   6. The inferior vena cava is normal in size with greater than 50%  respiratory variability, suggesting right atrial pressure of 3 mmHg.   Epic records are reviewed at length today  CHA2DS2-VASc Score = 2  The patient's score is based upon: CHF History: No HTN History: No Diabetes History: Yes Stroke History: No Vascular Disease History: No Age Score: 0 Gender Score: 1      ASSESSMENT AND PLAN: 1. Paroxysmal Atrial Fibrillation (ICD10:  I48.0) The patient's CHA2DS2-VASc score is 2, indicating a 2.2% annual risk of stroke.   Patient appears to be maintaining SR. We also discussed her stroke risk  and the risks and benefits of anticoagulation. With a low CV score, per guidelines she can discontinue Eliquis. Patient in agreement with stopping Eliquis today. Continue Lopressor 50 mg BID   2. Obesity Body mass index is 33.57 kg/m. Lifestyle modification was discussed and encouraged including regular physical activity and weight reduction.  3. Obstructive sleep apnea Patient reports compliance with CPAP therapy.   Follow up in the AF clinic in 6 months.    Jorja Loa PA-C [facility] [address] [place], [place] [address] [phone] 10/16/2020 11:21 AM

## 2020-10-17 ENCOUNTER — Ambulatory Visit (HOSPITAL_COMMUNITY): Payer: BC Managed Care – PPO | Admitting: Physician Assistant

## 2020-10-18 ENCOUNTER — Encounter: Payer: Self-pay | Admitting: Podiatry

## 2020-10-18 NOTE — Progress Notes (Signed)
  Subjective:  Patient ID: Jasmine Harrell, female    DOB: 01/03/1971,  MRN: [id number]  Chief Complaint  Patient presents with   Tendonitis     4 week re-check sesamoiditis /  left foot/ankle pain    50 y.o. female returns with the above complaint. History confirmed with patient.  Pain is improved when wearing the boot, she is having some medial ankle pain  Objective:  Physical Exam: warm, good capillary refill, no trophic changes or ulcerative lesions, normal DP and PT pulses, and normal sensory exam. Left Foot: Minimal pain on palpation of the sesamoids now Assessment:   1. Sesamoiditis of left foot   2. Tendonitis   3. Left foot pain       Plan:  Patient was evaluated and treated and all questions answered.  She is doing quite well she can transition out of the boot and I recommend she begin PTTD exercises for the medial ankle pain.  Return to boot if still painful she return to see me before she goes back to work  Return in about 3 weeks (around 11/05/2020).

## 2020-11-05 ENCOUNTER — Ambulatory Visit: Payer: BC Managed Care – PPO | Admitting: Podiatry

## 2020-11-05 ENCOUNTER — Other Ambulatory Visit: Payer: Self-pay

## 2020-11-05 DIAGNOSIS — M659 Synovitis and tenosynovitis, unspecified: Secondary | ICD-10-CM

## 2020-11-05 DIAGNOSIS — M21862 Other specified acquired deformities of left lower leg: Secondary | ICD-10-CM

## 2020-11-05 DIAGNOSIS — M76822 Posterior tibial tendinitis, left leg: Secondary | ICD-10-CM

## 2020-11-05 DIAGNOSIS — M216X2 Other acquired deformities of left foot: Secondary | ICD-10-CM | POA: Diagnosis not present

## 2020-11-05 NOTE — Progress Notes (Signed)
  Subjective:  Patient ID: Jasmine Harrell, female    DOB: [birth year],  MRN: [id number]  Chief Complaint  Patient presents with   Foot Pain      3 weeks  follow up for sesamoiditis of left foot    50 y.o. female returns with the above complaint. History confirmed with patient.  Pain under the big toe has improved but pain on the inside of the ankle is still very difficult for her.  She is not able to get out of the boot for any extended period of time.  Objective:  Physical Exam: warm, good capillary refill, no trophic changes or ulcerative lesions, normal DP and PT pulses, and normal sensory exam. Left Foot: No pain on the sesamoids, her pain is all along the posterior tibial and anterior tibial tendon and this is worse with resisted inversion dorsiflexion or plantarflexion Assessment:   1. 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 Gastrocnemius equinus of left lower extremity   3. Tibialis anterior tenosynovitis       Plan:  Patient was evaluated and treated and all questions answered.  Unfortunate she has regressed and her posterior tibial tendon and I think compensatory anterior tibial tendon pain has worsened.  This been going on for a few months now.  She has not improved with over 2 months of wearing a cam boot and doing home therapy exercises.  I am sending her to [facility] for physical therapy and ordering an MRI to evaluate the integrity of the tendon to guide any possible further treatment including possible surgical intervention if necessary.  She will return to see me after the MRI and MRIs completed in 1 month.  Return in about 1 month (around 12/06/2020) for after MRI to review posterior tibial tendonitis.

## 2020-11-05 NOTE — Patient Instructions (Signed)
Look for Voltaren gel at the pharmacy over the counter or online (also known as diclofenac 1% gel). Apply to the painful areas 3-4x daily with the supplied dosing card. Allow to dry for 10 minutes before going into socks/shoes  

## 2020-11-21 ENCOUNTER — Other Ambulatory Visit: Payer: Self-pay

## 2020-11-21 ENCOUNTER — Telehealth: Payer: Self-pay | Admitting: *Deleted

## 2020-11-21 ENCOUNTER — Ambulatory Visit
Admission: RE | Admit: 2020-11-21 | Discharge: 2020-11-21 | Disposition: A | Discharge status: Home / Self Care | Payer: BC Managed Care – PPO | Source: Ambulatory Visit | Attending: Podiatry | Admitting: Podiatry

## 2020-11-21 DIAGNOSIS — M65969 Unspecified synovitis and tenosynovitis, unspecified lower leg: Secondary | ICD-10-CM

## 2020-11-21 DIAGNOSIS — M76822 Posterior tibial tendinitis, left leg: Secondary | ICD-10-CM

## 2020-11-21 DIAGNOSIS — M659 Synovitis and tenosynovitis, unspecified: Secondary | ICD-10-CM

## 2020-11-21 NOTE — Telephone Encounter (Signed)
Patient is calling because she thought it was mentioned at last office visit (11/05/20)a referral would be sent to [facility] for PT.Please advise.

## 2020-11-25 NOTE — Telephone Encounter (Signed)
Faxed successful confirmation referral to [facility], 11/25/20

## 2020-12-19 ENCOUNTER — Ambulatory Visit: Payer: BC Managed Care – PPO | Admitting: Podiatry

## 2020-12-19 ENCOUNTER — Ambulatory Visit (INDEPENDENT_AMBULATORY_CARE_PROVIDER_SITE_OTHER): Payer: BC Managed Care – PPO

## 2020-12-19 ENCOUNTER — Other Ambulatory Visit: Payer: Self-pay

## 2020-12-19 DIAGNOSIS — M76822 Posterior tibial tendinitis, left leg: Secondary | ICD-10-CM | POA: Diagnosis not present

## 2020-12-19 DIAGNOSIS — M2141 Flat foot [pes planus] (acquired), right foot: Secondary | ICD-10-CM

## 2020-12-19 DIAGNOSIS — M2142 Flat foot [pes planus] (acquired), left foot: Secondary | ICD-10-CM

## 2020-12-23 ENCOUNTER — Other Ambulatory Visit (HOSPITAL_COMMUNITY): Payer: Self-pay | Admitting: Physician Assistant

## 2020-12-24 NOTE — Progress Notes (Signed)
  Subjective:  Patient ID: Jasmine Harrell, female    DOB: [birth year],  MRN: [id number]  Chief Complaint  Patient presents with   Tendonitis    MRI review and new xrays    50 y.o. female returns with the above complaint. History confirmed with patient.  Overall has improved quite a bit and physical therapy has been helpful.  Objective:  Physical Exam: warm, good capillary refill, no trophic changes or ulcerative lesions, normal DP and PT pulses, and normal sensory exam. Left Foot: No pain on the sesamoids, her pain is all along the posterior tibial and anterior tibial tendon and this is worse with resisted inversion dorsiflexion or plantarflexion  X-rays taken today show mild heel valgus  Study Result  Narrative & Impression  CLINICAL DATA:  Medial ankle pain and swelling since early June. No injury or prior surgery.   EXAM: MRI OF THE LEFT ANKLE WITHOUT CONTRAST   TECHNIQUE: Multiplanar, multisequence MR imaging of the ankle was performed. No intravenous contrast was administered.   COMPARISON:  Left ankle x-rays dated June 28, 2013.   FINDINGS: TENDONS   Peroneal: Peroneal longus tendon intact. Peroneal brevis intact.   Posteromedial: Severe distal posterior tibial tendinosis with partial tear at the navicular insertion. Small amount of fluid in the posterior tibial tendon sheath. Flexor digitorum longus tendon intact. Flexor hallucis longus tendon intact.   Anterior: Tibialis anterior tendon intact. Extensor hallucis longus tendon intact Extensor digitorum longus tendon intact.   Achilles:  Intact.   Plantar Fascia: Intact.   LIGAMENTS   Lateral: Anterior talofibular ligament intact. Calcaneofibular ligament intact. Posterior talofibular ligament intact. Anterior and posterior tibiofibular ligaments intact.   Medial: Deltoid ligament intact. Spring ligament intact.   CARTILAGE   Ankle Joint: No joint effusion. 5 x 5 mm focus of marrow edema in the  medial talar dome with overlying cortical and cartilage irregularity, consistent with osteochondral lesion.   Subtalar Joints/Sinus Tarsi: Normal subtalar joints. No subtalar joint effusion. Normal sinus tarsi.   Bones: Reactive marrow edema in the medial navicular. No acute fracture or dislocation. No suspicious bone lesion. Os trigonum. Mild pes planus.   Soft Tissue: Medial hindfoot soft tissue swelling. No soft tissue mass or fluid collection.   IMPRESSION: 1. Severe distal posterior tibial tendinosis with partial tear at the navicular insertion. 2. Small osteochondral lesion of the medial talar dome.     Electronically Signed   By: Obie Dredge M.D.   On: 11/22/2020 20:37   Assessment:   1. Posterior tibial tendinitis of left lower extremity       Plan:  Patient was evaluated and treated and all questions answered.  Has improved quite a bit with physical therapy hopefully she can avoid surgery at this point despite the tendinosis and partial tearing in the distal PT tendon.  We discussed that if this was not successful reconstructive surgery would likely be the option.  I recommend we support this with a custom molded orthosis which she was casted for today with a large medial flange deep heel cup and arch support to protect the partial tearing in the tendon.  She will return to see me after the orthotics have been made and she has worn them for some time  No follow-ups on file.

## 2020-12-25 ENCOUNTER — Encounter: Payer: Self-pay | Admitting: Podiatry

## 2021-01-09 ENCOUNTER — Telehealth: Payer: Self-pay | Admitting: Podiatry

## 2021-01-09 NOTE — Telephone Encounter (Signed)
Pt left message yesterday asking if her orthotics that were ordered on 9.22 where back..   I returned call today and left message for pt that we have not gotten them in yet but should be anytime and I would call when they come in.

## 2021-01-20 ENCOUNTER — Telehealth: Payer: Self-pay | Admitting: Podiatry

## 2021-01-20 NOTE — Telephone Encounter (Signed)
Orthotics in.. pt aware due to her schedule pt is going to pick them up. She has had orthotics in the past and is aware of how to wear them. I did put in the informational paper with the orthotics for the pt.

## 2021-03-06 ENCOUNTER — Ambulatory Visit: Payer: BC Managed Care – PPO | Admitting: Podiatry

## 2021-03-06 ENCOUNTER — Other Ambulatory Visit: Payer: Self-pay

## 2021-03-06 DIAGNOSIS — M2141 Flat foot [pes planus] (acquired), right foot: Secondary | ICD-10-CM

## 2021-03-06 DIAGNOSIS — M2142 Flat foot [pes planus] (acquired), left foot: Secondary | ICD-10-CM

## 2021-03-06 DIAGNOSIS — M76822 Posterior tibial tendinitis, left leg: Secondary | ICD-10-CM

## 2021-03-10 NOTE — Progress Notes (Signed)
  Subjective:  Patient ID: Jasmine Harrell, female    DOB: 08/07/1970,  MRN: [id number]  Chief Complaint  Patient presents with   Tendonitis     4th week of wearing orthotic follow up    50 y.o. female returns with the above complaint. History confirmed with patient.  Doing very well its nearly 90 to 95% better at this point  Objective:  Physical Exam: warm, good capillary refill, no trophic changes or ulcerative lesions, normal DP and PT pulses, and normal sensory exam. Left Foot: Doing well no pain 5 out of 5 strength in eversion and inversion  X-rays taken today show mild heel valgus  Study Result  Narrative & Impression  CLINICAL DATA:  Medial ankle pain and swelling since early June. No injury or prior surgery.   EXAM: MRI OF THE LEFT ANKLE WITHOUT CONTRAST   TECHNIQUE: Multiplanar, multisequence MR imaging of the ankle was performed. No intravenous contrast was administered.   COMPARISON:  Left ankle x-rays dated June 28, 2013.   FINDINGS: TENDONS   Peroneal: Peroneal longus tendon intact. Peroneal brevis intact.   Posteromedial: Severe distal posterior tibial tendinosis with partial tear at the navicular insertion. Small amount of fluid in the posterior tibial tendon sheath. Flexor digitorum longus tendon intact. Flexor hallucis longus tendon intact.   Anterior: Tibialis anterior tendon intact. Extensor hallucis longus tendon intact Extensor digitorum longus tendon intact.   Achilles:  Intact.   Plantar Fascia: Intact.   LIGAMENTS   Lateral: Anterior talofibular ligament intact. Calcaneofibular ligament intact. Posterior talofibular ligament intact. Anterior and posterior tibiofibular ligaments intact.   Medial: Deltoid ligament intact. Spring ligament intact.   CARTILAGE   Ankle Joint: No joint effusion. 5 x 5 mm focus of marrow edema in the medial talar dome with overlying cortical and cartilage irregularity, consistent with osteochondral  lesion.   Subtalar Joints/Sinus Tarsi: Normal subtalar joints. No subtalar joint effusion. Normal sinus tarsi.   Bones: Reactive marrow edema in the medial navicular. No acute fracture or dislocation. No suspicious bone lesion. Os trigonum. Mild pes planus.   Soft Tissue: Medial hindfoot soft tissue swelling. No soft tissue mass or fluid collection.   IMPRESSION: 1. Severe distal posterior tibial tendinosis with partial tear at the navicular insertion. 2. Small osteochondral lesion of the medial talar dome.     Electronically Signed   By: Obie Dredge M.D.   On: 11/22/2020 20:37   Assessment:   1. 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 Pes planus of both feet        Plan:  Patient was evaluated and treated and all questions answered.  Doing well the orthotics are very comfortable and she is nearly fully pain-free.  She will return to see me as needed for this or other issues.  Hold off on any further surgical considerations unless this returns or worsens  No follow-ups on file.

## 2021-04-14 ENCOUNTER — Other Ambulatory Visit: Payer: Self-pay

## 2021-04-14 ENCOUNTER — Ambulatory Visit (HOSPITAL_COMMUNITY)
Admission: RE | Admit: 2021-04-14 | Discharge: 2021-04-14 | Disposition: A | Discharge status: Home / Self Care | Payer: BC Managed Care – PPO | Source: Ambulatory Visit | Attending: Physician Assistant | Admitting: Physician Assistant

## 2021-04-14 ENCOUNTER — Encounter (HOSPITAL_COMMUNITY): Payer: Self-pay | Admitting: Physician Assistant

## 2021-04-14 VITALS — BP 114/76 | HR 83 | Ht 66.0 in | Wt 204.8 lb

## 2021-04-14 DIAGNOSIS — Z6833 Body mass index (BMI) 33.0-33.9, adult: Secondary | ICD-10-CM | POA: Insufficient documentation

## 2021-04-14 DIAGNOSIS — F909 Attention-deficit hyperactivity disorder, unspecified type: Secondary | ICD-10-CM | POA: Diagnosis not present

## 2021-04-14 DIAGNOSIS — E669 Obesity, unspecified: Secondary | ICD-10-CM | POA: Insufficient documentation

## 2021-04-14 DIAGNOSIS — I480 Paroxysmal atrial fibrillation: Secondary | ICD-10-CM | POA: Insufficient documentation

## 2021-04-14 DIAGNOSIS — F319 Bipolar disorder, unspecified: Secondary | ICD-10-CM | POA: Insufficient documentation

## 2021-04-14 DIAGNOSIS — E119 Type 2 diabetes mellitus without complications: Secondary | ICD-10-CM | POA: Insufficient documentation

## 2021-04-14 DIAGNOSIS — G4733 Obstructive sleep apnea (adult) (pediatric): Secondary | ICD-10-CM | POA: Diagnosis not present

## 2021-04-14 MED ORDER — METOPROLOL TARTRATE 50 MG PO TABS: ORAL_TABLET | ORAL | Status: AC

## 2021-04-14 NOTE — Progress Notes (Signed)
Primary Care Physician: Marden Noble, MD Primary Cardiologist: none Primary Electrophysiologist: none Referring Physician: [facility]    Jasmine Harrell is a 51 y.o. female with a history of OSA, DM, ADHD, bipolar, and atrial fibrillation who presents for follow up in the [facility]. The patient was initially diagnosed with atrial fibrillation 09/12/20 after presenting to the ED with symptoms of palpitations, CP, and SOB. This occurred in the middle of the night and woke her from sleep. She was given a diltiazem bolus and drip in the ED which quickly converted her to SR. Patient has a CHADS2VASC score of 2. She does have OSA and has been compliant with her CPAP. She was on a steroid taper just prior to the onset of her symptoms.   On follow up today, patient reports she has done well since her last visit. She denies any heart racing or palpitations. She remains active by playing pickle ball.   Today, she denies symptoms of palpitations, chest pain, shortness of breath, orthopnea, PND, lower extremity edema, dizziness, presyncope, syncope, bleeding, or neurologic sequela. The patient is tolerating medications without difficulties and is otherwise without complaint today.    Atrial Fibrillation Risk Factors:  she does have symptoms or diagnosis of sleep apnea. she is compliant with CPAP therapy. she does not have a history of rheumatic fever. she does not have a history of alcohol use. The patient does not have a history of early familial atrial fibrillation or other arrhythmias.  she has a BMI of Body mass index is 33.06 kg/m.Marland Kitchen Filed Weights   04/14/21 1538  Weight: 92.9 kg     No family history on file.   Atrial Fibrillation Management history:  Previous antiarrhythmic drugs: none Previous cardioversions: none Previous ablations: none CHADS2VASC score: 2 Anticoagulation history: Eliquis   Past Medical History:  Diagnosis Date    Anxiety    Asthma    exercise-induced; no inhaler use in years   Attention deficit disorder (ADD)    Chronic cough    Complication of anesthesia    hard to wake up post-op   Deviated septum 04/2012   GERD (gastroesophageal reflux disease)    no current med.   H/O seasonal allergies    Headache(784.0)    sinus 1 x/week; migraine 1 x/month   Needle phobia    hyperventilates with needle sticks   Sleep apnea    cpap used   Past Surgical History:  Procedure Laterality Date   CESAREAN SECTION  10/22/2004; 06/08/2001   ESOPHAGOGASTRODUODENOSCOPY (EGD) WITH PROPOFOL N/A 10/29/2014   Procedure: ESOPHAGOGASTRODUODENOSCOPY (EGD) WITH PROPOFOL;  Surgeon: Charolett Bumpers, MD;  Location: [facility];  Service: Endoscopy;  Laterality: N/A;   NASAL SEPTOPLASTY W/ TURBINOPLASTY Bilateral 05/25/2012   Procedure: NASAL SEPTOPLASTY WITH TURBINATE REDUCTION;  Surgeon: Osborn Coho, MD;  Location: [facility];  Service: ENT;  Laterality: Bilateral;  Nasal Septoplasty with Bilateral Turbinate Reduction   WISDOM TOOTH EXTRACTION      Current Outpatient Medications  Medication Sig Dispense Refill   acetaminophen (TYLENOL) 500 MG tablet Take 500 mg by mouth every 6 (six) hours as needed for moderate pain or mild pain.     albuterol (VENTOLIN HFA) 108 (90 Base) MCG/ACT inhaler ProAir HFA 90 mcg/actuation aerosol inhaler     atorvastatin (LIPITOR) 10 MG tablet atorvastatin 10 mg tablet     Azelastine-Fluticasone 137-50 MCG/ACT SUSP Dymista 137 mcg-50 mcg/spray nasal spray     fluticasone (FLONASE) 50  MCG/ACT nasal spray fluticasone propionate 50 mcg/actuation nasal spray,suspension     glimepiride (AMARYL) 2 MG tablet glimepiride 2 mg tablet     lamoTRIgine (LAMICTAL) 150 MG tablet Take 0.5 tablets by mouth 2 (two) times daily.     lansoprazole (PREVACID) 30 MG capsule Take 30 mg by mouth daily at 12 noon.     levocetirizine (XYZAL) 5 MG tablet levocetirizine 5 mg tablet     levonorgestrel  (MIRENA, 52 MG,) 20 MCG/DAY IUD Mirena 20 mcg/24 hours (7 yrs) 52 mg intrauterine device  Take 1 insert by intrauterine route.     metFORMIN (GLUCOPHAGE-XR) 500 MG 24 hr tablet Take 500 mg by mouth daily.     methylphenidate (RITALIN LA) 30 MG 24 hr capsule 1 capsule in the morning     Semaglutide, 1 MG/DOSE, (OZEMPIC, 1 MG/DOSE,) 4 MG/3ML SOPN 1 mg     No current facility-administered medications for this encounter.    Allergies  Allergen Reactions   Erythromycin Nausea And Vomiting   Sulfa Antibiotics     Other reaction(s): diarrhea   Topamax [Topiramate] Other (See Comments)    Blurred vision    Social History   Socioeconomic History   Marital status: Married    Spouse name: Not on file   Number of children: Not on file   Years of education: Not on file   Highest education level: Not on file  Occupational History   Not on file  Tobacco Use   Smoking status: Former    Types: Cigarettes    Quit date: 10/22/1994    Years since quitting: 26.4   Smokeless tobacco: Never   Tobacco comments:    social- college years  Substance and Sexual Activity   Alcohol use: No   Drug use: No   Sexual activity: Yes    Birth control/protection: I.U.D.  Other Topics Concern   Not on file  Social History Narrative   Not on file   Social Determinants of Health   Financial Resource Strain: Not on file  Food Insecurity: Not on file  Transportation Needs: Not on file  Physical Activity: Not on file  Stress: Not on file  Social Connections: Not on file  Intimate Partner Violence: Not on file     ROS- All systems are reviewed and negative except as per the HPI above.  Physical Exam: Vitals:   04/14/21 1538  BP: 114/76  Pulse: 83  Weight: 92.9 kg  Height: 5\' 6"  (1.676 m)   GEN- The patient is a well appearing obese female, alert and oriented x 3 today.   HEENT-head normocephalic, atraumatic, sclera clear, conjunctiva pink, hearing intact, trachea midline. Lungs- Clear to  ausculation bilaterally, normal work of breathing Heart- Regular rate and rhythm, no murmurs, rubs or gallops  GI- soft, NT, ND, + BS Extremities- no clubbing, cyanosis, or edema MS- no significant deformity or atrophy Skin- no rash or lesion Psych- euthymic mood, full affect Neuro- strength and sensation are intact   Wt Readings from Last 3 Encounters:  04/14/21 92.9 kg  10/16/20 94.3 kg  09/17/20 92.5 kg    EKG today demonstrates  SR Vent. rate 83 BPM PR interval 132 ms QRS duration 72 ms QT/QTcB 360/423 ms  Echo 10/04/20  1. Left ventricular ejection fraction, by estimation, is 60 to 65%. The  left ventricle has normal function. The left ventricle has no regional  wall motion abnormalities. Left ventricular diastolic parameters were  normal.   2. Right ventricular systolic function  is normal. The right ventricular  size is normal. There is normal pulmonary artery systolic pressure.   3. Left atrial size was mildly dilated.   4. The mitral valve is normal in structure. No evidence of mitral valve  regurgitation. No evidence of mitral stenosis.   5. The aortic valve is grossly normal. Aortic valve regurgitation is not  visualized. No aortic stenosis is present.   6. The inferior vena cava is normal in size with greater than 50%  respiratory variability, suggesting right atrial pressure of 3 mmHg.   Epic records are reviewed at length today  CHA2DS2-VASc Score = 2  The patient's score is based upon: CHF History: 0 HTN History: 0 Diabetes History: 1 Stroke History: 0 Vascular Disease History: 0 Age Score: 0 Gender Score: 1       ASSESSMENT AND PLAN: 1. Paroxysmal Atrial Fibrillation (ICD10:  I48.0) The patient's CHA2DS2-VASc score is 2, indicating a 2.2% annual risk of stroke.   Patient appears to be maintaining SR. No anticoagulation at this time with CV score < 3.  Continue Lopressor 25 mg q 6 hours PRN for heart racing.   2. Obesity Body mass index is  33.06 kg/m. Lifestyle modification was discussed and encouraged including regular physical activity and weight reduction.  3. Obstructive sleep apnea Patient reports compliance with CPAP therapy.   Follow up in the AF clinic in one year.    [facility] [address] [place], [place] [address] [phone] 04/14/2021 3:44 PM

## 2022-12-30 ENCOUNTER — Other Ambulatory Visit: Payer: Self-pay | Admitting: Obstetrics and Gynecology

## 2022-12-30 DIAGNOSIS — R928 Other abnormal and inconclusive findings on diagnostic imaging of breast: Secondary | ICD-10-CM

## 2023-01-14 IMAGING — DX DG CHEST 1V PORT
1 series · 1 of 1 positions shown · non-contrast
Comparison: Chest radiograph 09/19/2015 and earlier.

CLINICAL DATA: 49-year-old female with chest pain and chest
tightness with shortness of breath since 4544 hours.

EXAM:
PORTABLE CHEST 1 VIEW

[chest]
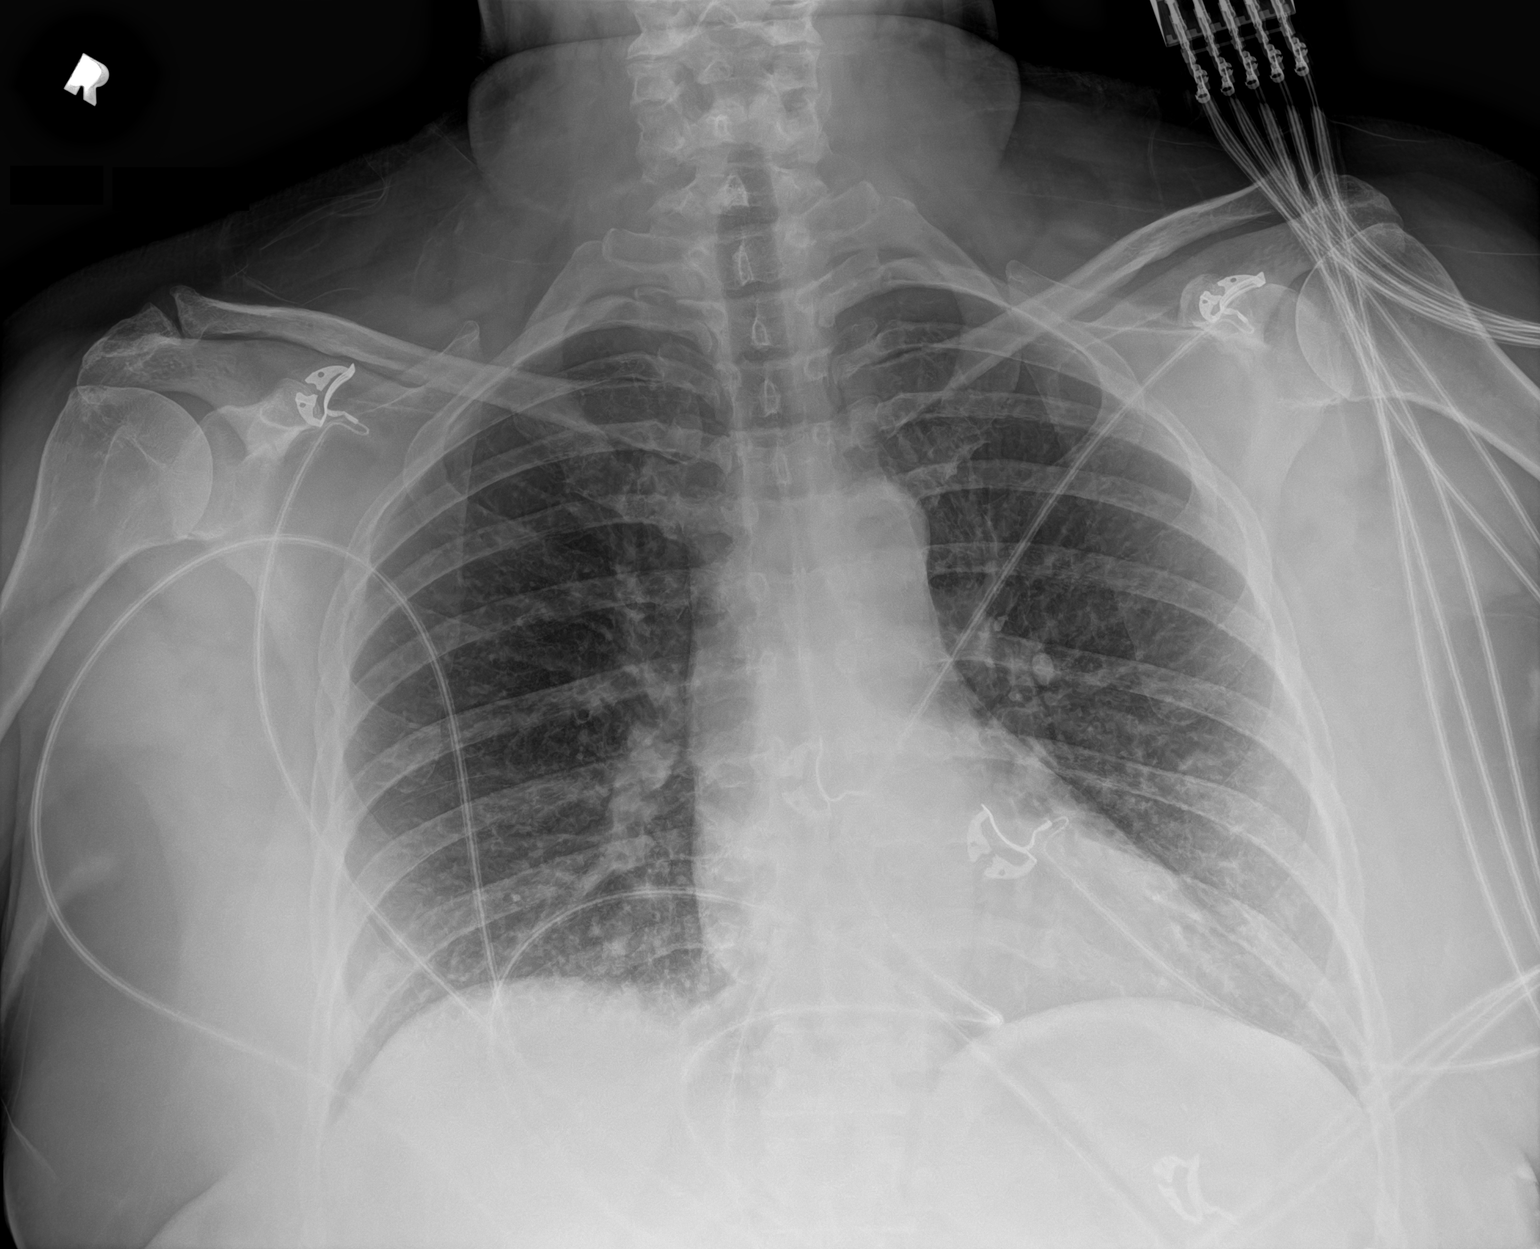

[1 of 1 positions shown; findings below may reference images not displayed]

FINDINGS: Portable AP semi upright view at 9041 hours. Mildly lower lung
volumes. Mediastinal contours remain normal. Visualized tracheal air
column is within normal limits. Allowing for portable technique the
lungs are clear. No pneumothorax or pleural effusion. No acute
osseous abnormality identified. Paucity of bowel gas in the upper
abdomen.
IMPRESSION: Lower lung volumes.  No acute cardiopulmonary abnormality.

## 2023-01-18 ENCOUNTER — Ambulatory Visit: Admission: RE | Admit: 2023-01-18 | Payer: BC Managed Care – PPO | Source: Ambulatory Visit

## 2023-01-18 ENCOUNTER — Ambulatory Visit
Admission: RE | Admit: 2023-01-18 | Discharge: 2023-01-18 | Disposition: A | Discharge status: Home / Self Care | Payer: BC Managed Care – PPO | Source: Ambulatory Visit | Attending: Obstetrics and Gynecology | Admitting: Obstetrics and Gynecology

## 2023-01-18 DIAGNOSIS — R928 Other abnormal and inconclusive findings on diagnostic imaging of breast: Secondary | ICD-10-CM

## 2023-07-14 ENCOUNTER — Ambulatory Visit (HOSPITAL_COMMUNITY)
Admission: RE | Admit: 2023-07-14 | Discharge: 2023-07-14 | Disposition: A | Discharge status: Home / Self Care | Source: Ambulatory Visit | Attending: Vascular Surgery | Admitting: Vascular Surgery

## 2023-07-14 ENCOUNTER — Other Ambulatory Visit (HOSPITAL_COMMUNITY): Payer: Self-pay | Admitting: Internal Medicine

## 2023-07-14 DIAGNOSIS — R600 Localized edema: Secondary | ICD-10-CM
